# Patient Record
Sex: Female | Born: 1935 | Race: White | Hispanic: No | Smoking: Never smoker
Health system: Southern US, Community
[De-identification: ages and names within clinical notes are randomized; demographics above are authoritative.]

## PROBLEM LIST (undated history)

## (undated) DIAGNOSIS — I10 Essential (primary) hypertension: Secondary | ICD-10-CM

## (undated) DIAGNOSIS — K5790 Diverticulosis of intestine, part unspecified, without perforation or abscess without bleeding: Secondary | ICD-10-CM

## (undated) DIAGNOSIS — C189 Malignant neoplasm of colon, unspecified: Secondary | ICD-10-CM

## (undated) HISTORY — PX: CATARACT EXTRACTION: SUR2

## (undated) HISTORY — PX: PARTIAL COLECTOMY: SHX5273

---

## 1973-02-01 HISTORY — PX: VAGINAL HYSTERECTOMY: SUR661

## 2000-02-02 DIAGNOSIS — C189 Malignant neoplasm of colon, unspecified: Secondary | ICD-10-CM

## 2000-02-02 HISTORY — DX: Malignant neoplasm of colon, unspecified: C18.9

## 2000-02-02 HISTORY — PX: OTHER SURGICAL HISTORY: SHX169

## 2004-10-28 ENCOUNTER — Ambulatory Visit: Payer: Self-pay | Admitting: Internal Medicine

## 2005-10-29 ENCOUNTER — Ambulatory Visit: Payer: Self-pay | Admitting: Internal Medicine

## 2006-02-23 ENCOUNTER — Ambulatory Visit: Payer: Self-pay | Admitting: Unknown Physician Specialty

## 2006-11-01 ENCOUNTER — Ambulatory Visit: Payer: Self-pay | Admitting: Internal Medicine

## 2007-11-02 ENCOUNTER — Ambulatory Visit: Payer: Self-pay | Admitting: Internal Medicine

## 2008-11-06 ENCOUNTER — Ambulatory Visit: Payer: Self-pay | Admitting: Internal Medicine

## 2009-11-13 ENCOUNTER — Ambulatory Visit: Payer: Self-pay | Admitting: Internal Medicine

## 2010-12-01 ENCOUNTER — Ambulatory Visit: Payer: Self-pay | Admitting: Internal Medicine

## 2010-12-03 ENCOUNTER — Ambulatory Visit: Payer: Self-pay | Admitting: Internal Medicine

## 2010-12-28 ENCOUNTER — Ambulatory Visit: Payer: Self-pay | Admitting: Surgery

## 2010-12-29 LAB — PATHOLOGY REPORT

## 2011-02-02 HISTORY — PX: BREAST BIOPSY: SHX20

## 2011-03-25 ENCOUNTER — Ambulatory Visit: Payer: Self-pay | Admitting: Unknown Physician Specialty

## 2011-07-01 ENCOUNTER — Ambulatory Visit: Payer: Self-pay | Admitting: Surgery

## 2011-12-02 ENCOUNTER — Ambulatory Visit: Payer: Self-pay | Admitting: Internal Medicine

## 2012-12-05 ENCOUNTER — Ambulatory Visit: Payer: Self-pay | Admitting: Internal Medicine

## 2013-12-06 ENCOUNTER — Ambulatory Visit: Payer: Self-pay | Admitting: Internal Medicine

## 2014-10-17 ENCOUNTER — Other Ambulatory Visit: Payer: Self-pay | Admitting: Internal Medicine

## 2014-10-17 DIAGNOSIS — Z1231 Encounter for screening mammogram for malignant neoplasm of breast: Secondary | ICD-10-CM

## 2014-12-09 ENCOUNTER — Other Ambulatory Visit: Payer: Self-pay | Admitting: Internal Medicine

## 2014-12-09 ENCOUNTER — Ambulatory Visit
Admission: RE | Admit: 2014-12-09 | Discharge: 2014-12-09 | Disposition: A | Discharge status: Home / Self Care | Payer: Medicare Other | Source: Ambulatory Visit | Attending: Internal Medicine | Admitting: Internal Medicine

## 2014-12-09 DIAGNOSIS — Z1231 Encounter for screening mammogram for malignant neoplasm of breast: Secondary | ICD-10-CM | POA: Insufficient documentation

## 2014-12-09 HISTORY — DX: Malignant neoplasm of colon, unspecified: C18.9

## 2015-09-26 ENCOUNTER — Emergency Department (HOSPITAL_COMMUNITY): Payer: Medicare Other

## 2015-09-26 ENCOUNTER — Encounter (HOSPITAL_COMMUNITY): Payer: Self-pay | Admitting: Emergency Medicine

## 2015-09-26 ENCOUNTER — Inpatient Hospital Stay (HOSPITAL_COMMUNITY)
Admission: EM | Admit: 2015-09-26 | Discharge: 2015-10-01 | DRG: 473 | Disposition: A | Discharge status: Rehabilitation Facility | Payer: Medicare Other | Attending: Neurological Surgery | Admitting: Neurological Surgery

## 2015-09-26 DIAGNOSIS — S14126S Central cord syndrome at C6 level of cervical spinal cord, sequela: Secondary | ICD-10-CM | POA: Diagnosis not present

## 2015-09-26 DIAGNOSIS — S14129A Central cord syndrome at unspecified level of cervical spinal cord, initial encounter: Secondary | ICD-10-CM | POA: Diagnosis present

## 2015-09-26 DIAGNOSIS — N319 Neuromuscular dysfunction of bladder, unspecified: Secondary | ICD-10-CM | POA: Diagnosis not present

## 2015-09-26 DIAGNOSIS — S14109D Unspecified injury at unspecified level of cervical spinal cord, subsequent encounter: Secondary | ICD-10-CM | POA: Diagnosis not present

## 2015-09-26 DIAGNOSIS — G8918 Other acute postprocedural pain: Secondary | ICD-10-CM | POA: Diagnosis not present

## 2015-09-26 DIAGNOSIS — M542 Cervicalgia: Secondary | ICD-10-CM

## 2015-09-26 DIAGNOSIS — W100XXA Fall (on)(from) escalator, initial encounter: Secondary | ICD-10-CM | POA: Diagnosis present

## 2015-09-26 DIAGNOSIS — K592 Neurogenic bowel, not elsewhere classified: Secondary | ICD-10-CM | POA: Diagnosis not present

## 2015-09-26 DIAGNOSIS — Z419 Encounter for procedure for purposes other than remedying health state, unspecified: Secondary | ICD-10-CM

## 2015-09-26 DIAGNOSIS — S12600A Unspecified displaced fracture of seventh cervical vertebra, initial encounter for closed fracture: Principal | ICD-10-CM | POA: Diagnosis present

## 2015-09-26 DIAGNOSIS — M532X2 Spinal instabilities, cervical region: Secondary | ICD-10-CM | POA: Diagnosis present

## 2015-09-26 DIAGNOSIS — Z79899 Other long term (current) drug therapy: Secondary | ICD-10-CM | POA: Diagnosis not present

## 2015-09-26 DIAGNOSIS — M792 Neuralgia and neuritis, unspecified: Secondary | ICD-10-CM | POA: Diagnosis not present

## 2015-09-26 DIAGNOSIS — I10 Essential (primary) hypertension: Secondary | ICD-10-CM | POA: Diagnosis not present

## 2015-09-26 DIAGNOSIS — S14129D Central cord syndrome at unspecified level of cervical spinal cord, subsequent encounter: Secondary | ICD-10-CM | POA: Diagnosis not present

## 2015-09-26 DIAGNOSIS — M4322 Fusion of spine, cervical region: Secondary | ICD-10-CM | POA: Diagnosis present

## 2015-09-26 DIAGNOSIS — R269 Unspecified abnormalities of gait and mobility: Secondary | ICD-10-CM | POA: Diagnosis not present

## 2015-09-26 DIAGNOSIS — S14105A Unspecified injury at C5 level of cervical spinal cord, initial encounter: Secondary | ICD-10-CM

## 2015-09-26 DIAGNOSIS — Y9259 Other trade areas as the place of occurrence of the external cause: Secondary | ICD-10-CM

## 2015-09-26 DIAGNOSIS — S134XXA Sprain of ligaments of cervical spine, initial encounter: Secondary | ICD-10-CM

## 2015-09-26 DIAGNOSIS — S14109A Unspecified injury at unspecified level of cervical spinal cord, initial encounter: Secondary | ICD-10-CM | POA: Diagnosis present

## 2015-09-26 DIAGNOSIS — D72829 Elevated white blood cell count, unspecified: Secondary | ICD-10-CM

## 2015-09-26 HISTORY — DX: Essential (primary) hypertension: I10

## 2015-09-26 HISTORY — DX: Diverticulosis of intestine, part unspecified, without perforation or abscess without bleeding: K57.90

## 2015-09-26 LAB — COMPREHENSIVE METABOLIC PANEL
ALBUMIN: 3.7 g/dL (ref 3.5–5.0)
ALK PHOS: 71 U/L (ref 38–126)
ALT: 18 U/L (ref 14–54)
ANION GAP: 13 (ref 5–15)
AST: 29 U/L (ref 15–41)
BILIRUBIN TOTAL: 0.7 mg/dL (ref 0.3–1.2)
BUN: 15 mg/dL (ref 6–20)
CALCIUM: 9.5 mg/dL (ref 8.9–10.3)
CO2: 21 mmol/L — ABNORMAL LOW (ref 22–32)
Chloride: 104 mmol/L (ref 101–111)
Creatinine, Ser: 0.78 mg/dL (ref 0.44–1.00)
GFR calc Af Amer: >60 mL/min (ref >60)
GLUCOSE: 121 mg/dL — AB (ref 65–99)
Potassium: 3.7 mmol/L (ref 3.5–5.1)
Sodium: 138 mmol/L (ref 135–145)
TOTAL PROTEIN: 6.9 g/dL (ref 6.5–8.1)

## 2015-09-26 LAB — CBC WITH DIFFERENTIAL/PLATELET
BASOS PCT: 0 %
Basophils Absolute: 0 10*3/uL (ref 0.0–0.1)
Eosinophils Absolute: 0.2 10*3/uL (ref 0.0–0.7)
Eosinophils Relative: 2 %
HEMATOCRIT: 39.3 % (ref 36.0–46.0)
HEMOGLOBIN: 13 g/dL (ref 12.0–15.0)
LYMPHS ABS: 3.1 10*3/uL (ref 0.7–4.0)
LYMPHS PCT: 29 %
MCH: 29.7 pg (ref 26.0–34.0)
MCHC: 33.1 g/dL (ref 30.0–36.0)
MCV: 89.7 fL (ref 78.0–100.0)
MONO ABS: 0.7 10*3/uL (ref 0.1–1.0)
MONOS PCT: 7 %
NEUTROS ABS: 6.7 10*3/uL (ref 1.7–7.7)
NEUTROS PCT: 62 %
Platelets: 286 10*3/uL (ref 150–400)
RBC: 4.38 MIL/uL (ref 3.87–5.11)
RDW: 13.4 % (ref 11.5–15.5)
WBC: 10.8 10*3/uL — ABNORMAL HIGH (ref 4.0–10.5)

## 2015-09-26 LAB — PROTIME-INR
INR: 1.01
PROTHROMBIN TIME: 13.3 s (ref 11.4–15.2)

## 2015-09-26 LAB — I-STAT CG4 LACTIC ACID, ED: LACTIC ACID, VENOUS: 1.47 mmol/L (ref 0.5–1.9)

## 2015-09-26 MED ORDER — HYDROCODONE-ACETAMINOPHEN 5-325 MG PO TABS
1.0000 | ORAL_TABLET | ORAL | Status: DC | PRN
  Administered 2015-09-27 (×3): 2 via ORAL
  Administered 2015-09-27 (×2): 1 via ORAL
  Administered 2015-09-27 – 2015-09-29 (×8): 2 via ORAL
  Filled 2015-09-26 (×6): qty 2
  Filled 2015-09-26: qty 1
  Filled 2015-09-26 (×5): qty 2
  Filled 2015-09-26: qty 1

## 2015-09-26 MED ORDER — IOPAMIDOL (ISOVUE-300) INJECTION 61%
100.0000 mL | Freq: Once | INTRAVENOUS | Status: AC | PRN
  Administered 2015-09-26: 100 mL via INTRAVENOUS

## 2015-09-26 NOTE — H&P (Signed)
Subjective:   Patient is a 82 y.o. female seen regarding Central cord syndrome. Patient was [place] a [place] at a meeting earlier today and on an escalator when the escalator stopped and she fell backwards down several steps. She initially could not move her arms or legs. However, upon presentation to the emergency department by EMS she started to be able to move her legs though she continues to complain of burning and weakness in the hands. The patient first presented with complaints of arm pain and loss of strength of the arm(s). Onset of symptoms was a few hours ago, gradually improving since that time. Onset was related to a fall. The pain is described as very mild and occurs intermittently. The pain is rated mild, and is located at the base of the neck at times though she has moderate pain in the forearms.  Symptoms are exacerbated by none, and are relieved by none. History positive for trauma, details: As above. Previous work up includes cervical spine x-rays, results: presence of osteophytes and MRI of cervical spine, results: spinal stenosis.  No past medical history on file.  No past surgical history on file.  No Known Allergies  Social History  Substance Use Topics  . Smoking status: Not on file  . Smokeless tobacco: Not on file  . Alcohol use Not on file    No family history on file. Prior to Admission medications   Medication Sig Start Date End Date Taking? Authorizing Provider  calcium carbonate (OSCAL) 1500 (600 Ca) MG TABS tablet Take 1,500 mg by mouth 2 (two) times daily with a meal.   Yes Historical Provider, MD  Cholecalciferol (VITAMIN D) 2000 units tablet Take 2,000 Units by mouth daily.   Yes Historical Provider, MD  lisinopril-hydrochlorothiazide (PRINZIDE,ZESTORETIC) 20-25 MG tablet Take 1 tablet by mouth daily.   Yes Historical Provider, MD  Multiple Vitamin (MULTIVITAMIN WITH MINERALS) TABS tablet Take 1 tablet by mouth every evening.   Yes Historical  Provider, MD     Review of Systems  Positive ROS: neg  All other systems have been reviewed and were otherwise negative with the exception of those mentioned in the HPI and as above.  Objective: Vital signs in last 24 hours: Temp:  [97.2 F (36.2 C)] 97.2 F (36.2 C) (08/25 1708) Pulse Rate:  [68-95] 68 (08/25 2100) Resp:  [13-20] 17 (08/25 2100) BP: (156-178)/(66-82) 170/66 (08/25 2100) SpO2:  [95 %-99 %] 96 % (08/25 2100)  General Appearance: Alert, cooperative, no distress, appears stated age Head: Normocephalic, without obvious abnormality, atraumatic Eyes: PERRL, conjunctiva/corneas clear, EOM's intact      Throat: Lips, mucosa, and tongue normal; teeth and gums normal Neck: Supple, symmetrical, trachea midline, she is in an Aspen collar Back: Symmetric, no curvature, ROM normal, no CVA tenderness Lungs:  respirations unlabored Heart: Regular rate and rhythm Abdomen: Soft, non-tender, bowel sounds active all four quadrants, no masses, no organomegaly Extremities: Extremities normal, atraumatic, no cyanosis or edema, abrasions to left forearm Pulses: 2+ and symmetric all extremities Skin: Skin color, texture, turgor normal, no rashes or lesions  NEUROLOGIC:   Mental status: A&O x4, no aphasia, good AS, fund of knowledge and memory Motor Exam - grossly normal except for weakness of the handgrip measuring 4 minus out of 5 and weakness of the triceps measuring 4 minus out of 5 Sensory Exam - grossly normal except for may be some decreased light touch in a glove distribution Reflexes: Normal with no Hoffmann's or clonus Coordination -  may have mild discoordination Gait - not tested Balance - not tested Cranial Nerves: I: smell Not tested  II: visual acuity  OS: na    OD: na  II: visual fields Full to confrontation  II: pupils Equal, round, reactive to light  III,VII: ptosis None  III,IV,VI: extraocular muscles  Full ROM  V: mastication Normal  V: facial light touch  sensation  Normal  V,VII: corneal reflex  Present  VII: facial muscle function - upper  Normal  VII: facial muscle function - lower Normal  VIII: hearing Not tested  IX: soft palate elevation  Normal  IX,X: gag reflex Present  XI: trapezius strength  5/5  XI: sternocleidomastoid strength 5/5  XI: neck flexion strength  5/5  XII: tongue strength  Normal    Data Review Lab Results  Component Value Date   WBC 10.8 (H) 09/26/2015   HGB 13.0 09/26/2015   HCT 39.3 09/26/2015   MCV 89.7 09/26/2015   PLT 286 09/26/2015   Lab Results  Component Value Date   NA 138 09/26/2015   K 3.7 09/26/2015   CL 104 09/26/2015   CO2 21 (L) 09/26/2015   BUN 15 09/26/2015   CREATININE 0.78 09/26/2015   GLUCOSE 121 (H) 09/26/2015   Lab Results  Component Value Date   INR 1.01 09/26/2015   CT scan of the cervical spine shows cervical spondylosis at C5-6 and C6-7 but no obvious fracture  MRI of the cervical spine has been reviewed but the report has not been dictated by the radiologist but there is cervical spondylosis with a small area of signal change within the cord at the inferior part of the disc space of C6-7 with really no ongoing compression of the cord; there is signal change within the disc but I think it is probably degenerative in nature and not traumatic  Assessment/Plan: 82 year old female with a mild central cord syndrome with a small area of signal change within the cervical cord at C6-7 with no ongoing cord compression and an improving neurologic exam. No urgent or emergent cervical decompressive surgery is necessary. She may not need surgery at any time. She will need admission, pain control, therapy and probable rehabilitation. I suspect that her prognosis is quite good for a fairly full functional recovery. I have spent more than 30 minutes with she and her family in consultation and reviewed her MRI with the family and answered their questions to the best of my  ability   Inioluwa Baris S 09/26/2015 10:39 PM

## 2015-09-26 NOTE — Progress Notes (Signed)
Chaplain checked in on patient following x-rays and other procedures.   Son was present.  Patient appeared uncomfortable, serious but calm; she stated that she couldn't fully feel her hands. Offered emotional and spiritual support, and refreshments to son (who declined).    Please call as needed for f/u.     09/26/15 1900  Clinical Encounter Type  Visited With Patient and family together  Visit Type Initial  Referral From Physician  Consult/Referral To Chaplain  Stress Factors  Patient Stress Factors Health changes  Family Stress Factors Lack of knowledge

## 2015-09-26 NOTE — ED Notes (Signed)
Patient transported to MRI 

## 2015-09-26 NOTE — ED Notes (Signed)
Patient back from CT. Remains A&O x 4, denies pain.

## 2015-09-26 NOTE — ED Notes (Signed)
Patient alert and oriented.  Aspen collar remains on patient.  Off and on c/o tingling in her arms and hands.  Has not voided since this nurse has been here,

## 2015-09-26 NOTE — ED Notes (Signed)
Patient transported to CT 

## 2015-09-26 NOTE — ED Provider Notes (Signed)
[facility] DEPT Provider Note   CSN: [id number] Arrival date & time: 09/26/15  1704   History   Chief Complaint Chief Complaint  Patient presents with  . Fall    HPI Terri Bradley is a 82 y.o. female.  The history is provided by the patient and the EMS personnel.   82 year old female with history of colon cancer status post colectomy in early 2000s, hypertension presenting with fall. Onset was today, just prior to arrival. Context- patient was on an escalator that was going up, which then stopped moving, causing patient to lose her balance and start falling down the escalator. Associated symptoms include initially not being able to move both lower extremities and with weakness in her bilateral upper extremities, associated with neck pain. The symptoms have improved, and now patient only endorses some burning pain, numbness, and weakness in her bilateral upper extremities, most prominently in the hands. Mild severity currently. She denies LOC, and denies any precipitating symptoms to the fall, including palpitations, chest pain, shortness of breath, weakness, vision changes. She is not on anticoagulation or antiplatelets.   Past Medical History:  Diagnosis Date  . Hypertension     Patient Active Problem List   Diagnosis Date Noted  . 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 Central cord syndrome [facility]) 09/26/2015    Past Surgical History:  Procedure Laterality Date  . CATARACT EXTRACTION    . colon tumor removal  2002   tumor removed "somewhere in colon, no treatment was ever needed."    OB History    No data available       Home Medications    Prior to Admission medications   Medication Sig Start Date End Date Taking? Authorizing Provider  calcium carbonate (OSCAL) 1500 (600 Ca) MG TABS tablet Take 1,500 mg by mouth 2 (two) times daily with a meal.   Yes Historical Provider, MD  Cholecalciferol (VITAMIN D) 2000 units tablet Take 2,000 Units by mouth  daily.   Yes Historical Provider, MD  lisinopril-hydrochlorothiazide (PRINZIDE,ZESTORETIC) 20-25 MG tablet Take 1 tablet by mouth daily.   Yes Historical Provider, MD  Multiple Vitamin (MULTIVITAMIN WITH MINERALS) TABS tablet Take 1 tablet by mouth every evening.   Yes Historical Provider, MD    Family History No family history on file.  Social History Social History  Substance Use Topics  . Smoking status: Never Smoker  . Smokeless tobacco: Never Used  . Alcohol use No     Allergies   Review of patient's allergies indicates no known allergies.   Review of Systems Review of Systems  Constitutional: Negative for fever.  HENT: Negative for trouble swallowing.   Eyes: Negative for visual disturbance.  Respiratory: Negative for shortness of breath.   Cardiovascular: Negative for chest pain and palpitations.  Gastrointestinal: Negative for abdominal pain and vomiting.  Genitourinary: Negative for difficulty urinating.  Musculoskeletal: Positive for neck pain. Negative for back pain.  Skin: Positive for wound.  Allergic/Immunologic: Negative for immunocompromised state.  Neurological: Positive for weakness and numbness. Negative for syncope, facial asymmetry, speech difficulty and headaches.  Hematological: Does not bruise/bleed easily.  Psychiatric/Behavioral: Negative for confusion.  All other systems reviewed and are negative.    Physical Exam Updated Vital Signs BP 155/70   Pulse 70   Temp 97.2 F (36.2 C) (Oral)   Resp 17   Ht 5\' 9"  (1.753 m)   Wt 79.4 kg   SpO2 97%   BMI 25.84 kg/m   Physical Exam  Constitutional: She is oriented to person, place, and time. She appears well-developed and well-nourished. No distress.  HENT:  Head: Normocephalic and atraumatic.  No skull depressions or hematomas. No battles sign or raccoon eyes. No midface trauma. Mouth atraumatic. No hemotympanum.   Eyes: Conjunctivae are normal. Pupils are equal, round, and reactive to light.   Neck: Neck supple.  No midline C spine TTP, stepoff or deformity. Placed in aspen collar.   Cardiovascular: Normal rate, regular rhythm and intact distal pulses.   No murmur heard. Pulmonary/Chest: Effort normal and breath sounds normal. No respiratory distress.  Abdominal: Soft. There is no tenderness.  Musculoskeletal: She exhibits no edema or deformity.  No TTP over T or L spine, no stepoff or deformity  Neurological: She is alert and oriented to person, place, and time. She has normal reflexes. No cranial nerve deficit.  Strength intact in bilat lower extremities, symmetric, 5/5. 4/5 in bilat grip and elbow extension bilat in upper extremities, otherwise 5/5 in upper extremities. Diminished sensation to light touch throughout bilat hands.   Skin: Skin is warm and dry.  Small superficial abrasions to left lateral hip, bilat distal lower legs, left elbow. No bony deformity or effusions, ROM intact in adjacent joints  Psychiatric: She has a normal mood and affect.  Nursing note and vitals reviewed.    ED Treatments / Results  Labs (all labs ordered are listed, but only abnormal results are displayed) Labs Reviewed  CBC WITH DIFFERENTIAL/PLATELET - Abnormal; Notable for the following:       Result Value   WBC 10.8 (*)    All other components within normal limits  COMPREHENSIVE METABOLIC PANEL - Abnormal; Notable for the following:    CO2 21 (*)    Glucose, Bld 121 (*)    All other components within normal limits  LACTIC ACID, PLASMA  PROTIME-INR  LACTIC ACID, PLASMA  I-STAT CG4 LACTIC ACID, ED    EKG  EKG Interpretation None       Radiology Dg Pelvis 1-2 Views  Result Date: 09/26/2015 CLINICAL DATA:  Fall EXAM: PELVIS - 1-2 VIEW COMPARISON:  None. FINDINGS: No fracture or dislocation is seen. Mild joint space narrowing of the bilateral hips. Visualized bony pelvis appears intact. Degenerative changes of the lower lumbar spine. IMPRESSION: No fracture or dislocation is  seen. Electronically Signed   By: Julian Hy M.D.   On: 09/26/2015 18:30   Dg Elbow Complete Left  Result Date: 09/26/2015 CLINICAL DATA:  Fall EXAM: LEFT ELBOW - COMPLETE 3+ VIEW COMPARISON:  None. FINDINGS: No fracture or dislocation is seen. The joint spaces are preserved. Visualized soft tissues are within normal limits. No displaced elbow joint fat pads suggest an elbow joint effusion. No radiopaque foreign body is seen. IMPRESSION: No fracture, dislocation, or radiopaque foreign body is seen. Electronically Signed   By: Julian Hy M.D.   On: 09/26/2015 18:30   Dg Tibia/fibula Left  Result Date: 09/26/2015 CLINICAL DATA:  Fall EXAM: LEFT TIBIA AND FIBULA - 2 VIEW COMPARISON:  None. FINDINGS: No fracture or dislocation is seen. The joint spaces are preserved. Visualized soft tissues are within normal limits. IMPRESSION: No fracture or dislocation is seen. Electronically Signed   By: Julian Hy M.D.   On: 09/26/2015 18:31   Dg Tibia/fibula Right  Result Date: 09/26/2015 CLINICAL DATA:  Fall EXAM: RIGHT TIBIA AND FIBULA - 2 VIEW COMPARISON:  None. FINDINGS: No fracture or dislocation is seen. Mild degenerative changes of the knee. Visualized soft  tissues are within normal limits. IMPRESSION: No fracture or dislocation is seen. Electronically Signed   By: Julian Hy M.D.   On: 09/26/2015 18:31   Ct Head Wo Contrast  Addendum Date: 09/26/2015   ADDENDUM REPORT: 09/26/2015 23:06 ADDENDUM: Additional review of CT cervical spine examination demonstrates a nondisplaced anterior superior endplate corner fracture of the C7 vertebral body. Focal linear lucency and angularity at the superior articulating facet of C7 on the left suggesting an additional nondisplaced fracture in the posterior elements. Findings are better demonstrated on MRI cervical spine obtained today. See additional report. Electronically Signed   By: Lucienne Capers M.D.   On: 09/26/2015 23:06   Result Date:  09/26/2015 CLINICAL DATA:  Pt fell down an escalator. EXAM: CT HEAD WITHOUT CONTRAST CT CERVICAL SPINE WITHOUT CONTRAST TECHNIQUE: Multidetector CT imaging of the head and cervical spine was performed following the standard protocol without intravenous contrast. Multiplanar CT image reconstructions of the cervical spine were also generated. COMPARISON:  None. FINDINGS: CT HEAD FINDINGS Diffuse cerebral atrophy. Ventricular dilatation is likely due to central atrophy. Patchy low-attenuation changes in the deep white matter consistent with small vessel ischemia. Minimal basal ganglia calcifications, likely dystrophic. No mass effect or midline shift. No abnormal extra-axial fluid collections. Gray-white matter junctions are distinct. Basal cisterns are not effaced. No evidence of acute intracranial hemorrhage. No depressed skull fractures. Visualized paranasal sinuses and mastoid air cells are not opacified. CT CERVICAL SPINE FINDINGS Straightening of the usual cervical lordosis. This may be due to patient positioning but ligamentous injury or muscle spasm could also have this appearance and are not excluded. No anterior subluxation. Normal alignment of the cervical facet joints. Degenerative changes with narrowed cervical interspaces and endplate hypertrophic changes, most prominent at C4-5, C5-6, and C6-7 levels. Prominent hypertrophic changes demonstrated at C5-6 and C6-7 posteriorly may cause some impression on the central canal. Uncovertebral spurring causes bone encroachment upon neural foramina bilaterally. This is most prominent at C5-6 on the right. No vertebral compression deformities. No prevertebral soft tissue swelling. C1-2 articulation appears intact. No focal bone lesion or bone destruction. Vascular calcifications in the cervical carotid arteries. IMPRESSION: No acute intracranial abnormalities. Chronic atrophy and small vessel ischemic changes. Nonspecific straightening of usual cervical lordosis.  Diffuse degenerative changes in the cervical spine. No acute displaced fractures identified. Electronically Signed: By: Lucienne Capers M.D. On: 09/26/2015 18:52   Ct Chest W Contrast  Result Date: 09/26/2015 CLINICAL DATA:  Level 2 trauma. Pt fell down escalator. EXAM: CT CHEST, ABDOMEN, AND PELVIS WITH CONTRAST TECHNIQUE: Multidetector CT imaging of the chest, abdomen and pelvis was performed following the standard protocol during bolus administration of intravenous contrast. CONTRAST:  120mL ISOVUE-300 IOPAMIDOL (ISOVUE-300) INJECTION 61% COMPARISON:  None. FINDINGS: CT CHEST FINDINGS Mediastinum/Lymph Nodes: No masses, pathologically enlarged lymph nodes, or other significant abnormality. Normal caliber thoracic aorta. No aneurysm or dissection. Scattered calcification of the aorta. Calcification in the coronary arteries. Lungs/Pleura: Diffuse emphysematous changes in the lungs. Scattered fibrosis in the periphery in basis. No focal consolidation. Airways are patent. No pleural effusions. No pneumothorax. Musculoskeletal: No chest wall mass or suspicious bone lesions identified. Normal alignment of the thoracic spine. Degenerative changes throughout the spine. No compression deformities. Sternum and ribs appear intact. CT ABDOMEN PELVIS FINDINGS Hepatobiliary: Mild diffuse fatty infiltration of the liver. Gallbladder and bile ducts are unremarkable. Pancreas: No mass, inflammatory changes, or other significant abnormality. Spleen: Within normal limits in size and appearance. Adrenals/Urinary Tract: Minimal left adrenal gland nodule  measuring 10 mm diameter. Based on size criteria, likely to be benign. No evidence of hydronephrosis. Small parapelvic cysts in the kidneys. Stomach/Bowel: Prior right hemicolectomy with ileocolonic anastomosis. Stomach is decompressed. Small bowel are not abnormally distended. Colon is not distended. No free air or free fluid in the abdomen. Vascular/Lymphatic: No pathologically  enlarged lymph nodes. No evidence of abdominal aortic aneurysm. Calcification of the abdominal aorta. Reproductive: Surgical absence of the uterus. No pelvic mass or lymphadenopathy. Other: Diverticulosis of the sigmoid colon without evidence of diverticulitis. Musculoskeletal: Degenerative changes in the lumbar spine. No vertebral compression deformities. Sacrum, pelvis, and hips appear intact. Tarlov cysts in the sacrum. IMPRESSION: No acute posttraumatic changes suggested in the chest, abdomen, or pelvis. No evidence of mediastinal injury or pulmonary parenchymal injury. No evidence of solid organ injury or bowel perforation. Chronic changes as noted above. Electronically Signed   By: Lucienne Capers M.D.   On: 09/26/2015 19:34   Ct Cervical Spine Wo Contrast  Addendum Date: 09/26/2015   ADDENDUM REPORT: 09/26/2015 23:06 ADDENDUM: Additional review of CT cervical spine examination demonstrates a nondisplaced anterior superior endplate corner fracture of the C7 vertebral body. Focal linear lucency and angularity at the superior articulating facet of C7 on the left suggesting an additional nondisplaced fracture in the posterior elements. Findings are better demonstrated on MRI cervical spine obtained today. See additional report. Electronically Signed   By: Lucienne Capers M.D.   On: 09/26/2015 23:06   Result Date: 09/26/2015 CLINICAL DATA:  Pt fell down an escalator. EXAM: CT HEAD WITHOUT CONTRAST CT CERVICAL SPINE WITHOUT CONTRAST TECHNIQUE: Multidetector CT imaging of the head and cervical spine was performed following the standard protocol without intravenous contrast. Multiplanar CT image reconstructions of the cervical spine were also generated. COMPARISON:  None. FINDINGS: CT HEAD FINDINGS Diffuse cerebral atrophy. Ventricular dilatation is likely due to central atrophy. Patchy low-attenuation changes in the deep white matter consistent with small vessel ischemia. Minimal basal ganglia  calcifications, likely dystrophic. No mass effect or midline shift. No abnormal extra-axial fluid collections. Gray-white matter junctions are distinct. Basal cisterns are not effaced. No evidence of acute intracranial hemorrhage. No depressed skull fractures. Visualized paranasal sinuses and mastoid air cells are not opacified. CT CERVICAL SPINE FINDINGS Straightening of the usual cervical lordosis. This may be due to patient positioning but ligamentous injury or muscle spasm could also have this appearance and are not excluded. No anterior subluxation. Normal alignment of the cervical facet joints. Degenerative changes with narrowed cervical interspaces and endplate hypertrophic changes, most prominent at C4-5, C5-6, and C6-7 levels. Prominent hypertrophic changes demonstrated at C5-6 and C6-7 posteriorly may cause some impression on the central canal. Uncovertebral spurring causes bone encroachment upon neural foramina bilaterally. This is most prominent at C5-6 on the right. No vertebral compression deformities. No prevertebral soft tissue swelling. C1-2 articulation appears intact. No focal bone lesion or bone destruction. Vascular calcifications in the cervical carotid arteries. IMPRESSION: No acute intracranial abnormalities. Chronic atrophy and small vessel ischemic changes. Nonspecific straightening of usual cervical lordosis. Diffuse degenerative changes in the cervical spine. No acute displaced fractures identified. Electronically Signed: By: Lucienne Capers M.D. On: 09/26/2015 18:52   Mr Cervical Spine Wo Contrast  Result Date: 09/26/2015 CLINICAL DATA:  [place] [address] down escalator. Followup ligamentous injury or cord contusion. EXAM: MRI CERVICAL SPINE WITHOUT CONTRAST TECHNIQUE: Multiplanar, multisequence MR imaging of the cervical spine was performed. No intravenous contrast was administered. COMPARISON:  CT cervical spine September 26, 2015 at 1819 hours  FINDINGS: Alignment: Straightened cervical lordosis.  Minimal grade 1 C7-T1 anterolisthesis without spondylolysis. Vertebrae: Vertically oriented acute fracture through anterior superior endplate of C7 with focally disrupted anterior longitudinal ligament. T2 bright edema within the C6-7 disc. The remaining vertebral bodies are intact. Severe C6-7 disc height loss, moderate at C5-6 with decreased T2 signal within all disc compatible with desiccation. Moderate to severe chronic discogenic endplate changes [id number]-GRM and C6-7, moderate at C4-5. Cord: Subcentimeter focus of T2 bright signal within the spinal cord at C7, without volume loss. No syrinx. No susceptibility artifact to suggest hemorrhage. Craniocervical junction intact. Posterior Fossa, vertebral arteries, paraspinal tissues: Minimal effusion/hemorrhage within prevertebral space at C6-7. Interstitial bright STIR signal C6-7 interspinous space. Interstitial bright STIR signal within the head bilateral RIGHT greater than LEFT paraspinal soft tissues extending into the thoracic paraspinal soft tissues. Interstitial bright STIR signal RIGHT scalene muscles. Disc levels: C2-3: No disc bulge. Mild facet arthropathy. No canal stenosis or neural foraminal narrowing. C3-4: Uncovertebral hypertrophy and mild facet arthropathy without canal stenosis. Mild RIGHT neural foraminal narrowing. C4-5: Small central disc protrusion, uncovertebral hypertrophy mild facet arthropathy without canal stenosis. Mild RIGHT neural foraminal narrowing. C5-6: Small broad-based disc bulge, uncovertebral hypertrophy and mild facet arthropathy. Moderate canal stenosis. Severe RIGHT greater than LEFT neural foraminal narrowing. C6-7: Small broad-based disc bulge, uncovertebral hypertrophy mild facet arthropathy. Moderate canal stenosis. Severe RIGHT, moderate LEFT neural foraminal narrowing. C7-T1: Anterolisthesis. Severe RIGHT and mild LEFT facet arthropathy without canal stenosis. Severe RIGHT neural foraminal narrowing. IMPRESSION: Acute anterior  superior endplate nondisplaced C7 fracture. Focal anterior longitudinal ligament disruption C6-7. C6-7 interspinous ligament strain. Sub cm abnormal cord signal C7 concerning for cord contusion. No cord hemorrhage. Midgrade RIGHT greater than LEFT paraspinal and scalene muscle strain. Moderate canal stenosis C5-6 and C6-7. Neural foraminal narrowing C3-4 thru C7-T1: Severe at C5-6 through C7-T1. Electronically Signed   By: Elon Alas M.D.   On: 09/26/2015 22:41   Ct Abdomen Pelvis W Contrast  Result Date: 09/26/2015 CLINICAL DATA:  Level 2 trauma. Pt fell down escalator. EXAM: CT CHEST, ABDOMEN, AND PELVIS WITH CONTRAST TECHNIQUE: Multidetector CT imaging of the chest, abdomen and pelvis was performed following the standard protocol during bolus administration of intravenous contrast. CONTRAST:  133mL ISOVUE-300 IOPAMIDOL (ISOVUE-300) INJECTION 61% COMPARISON:  None. FINDINGS: CT CHEST FINDINGS Mediastinum/Lymph Nodes: No masses, pathologically enlarged lymph nodes, or other significant abnormality. Normal caliber thoracic aorta. No aneurysm or dissection. Scattered calcification of the aorta. Calcification in the coronary arteries. Lungs/Pleura: Diffuse emphysematous changes in the lungs. Scattered fibrosis in the periphery in basis. No focal consolidation. Airways are patent. No pleural effusions. No pneumothorax. Musculoskeletal: No chest wall mass or suspicious bone lesions identified. Normal alignment of the thoracic spine. Degenerative changes throughout the spine. No compression deformities. Sternum and ribs appear intact. CT ABDOMEN PELVIS FINDINGS Hepatobiliary: Mild diffuse fatty infiltration of the liver. Gallbladder and bile ducts are unremarkable. Pancreas: No mass, inflammatory changes, or other significant abnormality. Spleen: Within normal limits in size and appearance. Adrenals/Urinary Tract: Minimal left adrenal gland nodule measuring 10 mm diameter. Based on size criteria, likely to be  benign. No evidence of hydronephrosis. Small parapelvic cysts in the kidneys. Stomach/Bowel: Prior right hemicolectomy with ileocolonic anastomosis. Stomach is decompressed. Small bowel are not abnormally distended. Colon is not distended. No free air or free fluid in the abdomen. Vascular/Lymphatic: No pathologically enlarged lymph nodes. No evidence of abdominal aortic aneurysm. Calcification of the abdominal aorta. Reproductive: Surgical absence of the uterus. No pelvic  mass or lymphadenopathy. Other: Diverticulosis of the sigmoid colon without evidence of diverticulitis. Musculoskeletal: Degenerative changes in the lumbar spine. No vertebral compression deformities. Sacrum, pelvis, and hips appear intact. Tarlov cysts in the sacrum. IMPRESSION: No acute posttraumatic changes suggested in the chest, abdomen, or pelvis. No evidence of mediastinal injury or pulmonary parenchymal injury. No evidence of solid organ injury or bowel perforation. Chronic changes as noted above. Electronically Signed   By: Lucienne Capers M.D.   On: 09/26/2015 19:34    Procedures Procedures (including critical care time)  Medications Ordered in ED Medications  HYDROcodone-acetaminophen (NORCO/VICODIN) 5-325 MG per tablet 1-2 tablet (1 tablet Oral Given 09/27/15 0008)  iopamidol (ISOVUE-300) 61 % injection 100 mL (100 mLs Intravenous Contrast Given 09/26/15 1852)     Initial Impression / Assessment and Plan / ED Course  I have reviewed the triage vital signs and the nursing notes.  Pertinent labs & imaging results that were available during my care of the patient were reviewed by me and considered in my medical decision making (see chart for details).  Clinical Course    82 year old female with history of colon cancer status post colectomy in early 2000s, hypertension presenting with fall on escalator, as above. AF, VSS, ABCs intact on assessment in ED. Secondary exam notable for superficial abrasions, numbness in  bilateral hands and 4/5 strength on bilateral hand grip and elbow extension as above. Presentation concerning for possible spinal cord injury with central cord syndrome.   Tdap updated for superficial abrasions. No bony injuries on plain films. Labs ok. CT of head, CAP without acute traumatic injury, other than C spine CT with abnormal straightening of typical curvature of spine with concern for ligamentous injury. MRI confirmed cord contusion and ligamentous injury, C spine fracture. NSG consulted. Admitted to floor level of care with Neurosurgery for further care.   Case discussed with Dr. Billy Fischer, who oversaw management of this patient.    Final Clinical Impressions(s) / ED Diagnoses   Final diagnoses:  Neck pain  Contusion of cervical cord, initial encounter ([place])  Injury to ligament of cervical spine, initial encounter    New Prescriptions New Prescriptions   No medications on file     Ivin Booty, MD 09/28/15 [id number]    Gareth Morgan, MD 09/30/15 [phone]

## 2015-09-27 LAB — LACTIC ACID, PLASMA
LACTIC ACID, VENOUS: 1.2 mmol/L (ref 0.5–1.9)
Lactic Acid, Venous: 1.1 mmol/L (ref 0.5–1.9)

## 2015-09-27 MED ORDER — ONDANSETRON HCL 4 MG/2ML IJ SOLN: 4.0000 mg | INTRAMUSCULAR | Status: DC | PRN

## 2015-09-27 MED ORDER — SODIUM CHLORIDE 0.9 % IV SOLN: 250.0000 mL | INTRAVENOUS | Status: DC

## 2015-09-27 MED ORDER — ACETAMINOPHEN 650 MG RE SUPP
650.0000 mg | RECTAL | Status: DC | PRN
Start: 2015-09-27 — End: 2015-09-29

## 2015-09-27 MED ORDER — GABAPENTIN 100 MG PO CAPS
100.0000 mg | ORAL_CAPSULE | Freq: Two times a day (BID) | ORAL | Status: DC
  Administered 2015-09-27: 100 mg via ORAL
  Filled 2015-09-27: qty 1

## 2015-09-27 MED ORDER — POTASSIUM CHLORIDE IN NACL 20-0.9 MEQ/L-% IV SOLN
INTRAVENOUS | Status: DC
  Administered 2015-09-27: 01:00:00 via INTRAVENOUS
  Filled 2015-09-27 (×3): qty 1000

## 2015-09-27 MED ORDER — SODIUM CHLORIDE 0.9% FLUSH: 3.0000 mL | INTRAVENOUS | Status: DC | PRN

## 2015-09-27 MED ORDER — GABAPENTIN 300 MG PO CAPS
300.0000 mg | ORAL_CAPSULE | Freq: Two times a day (BID) | ORAL | Status: DC
  Administered 2015-09-27 – 2015-09-29 (×5): 300 mg via ORAL
  Filled 2015-09-27 (×5): qty 1

## 2015-09-27 MED ORDER — LISINOPRIL-HYDROCHLOROTHIAZIDE 20-25 MG PO TABS: 1.0000 | ORAL_TABLET | Freq: Every day | ORAL | Status: DC

## 2015-09-27 MED ORDER — ACETAMINOPHEN 325 MG PO TABS: 650.0000 mg | ORAL_TABLET | ORAL | Status: DC | PRN

## 2015-09-27 MED ORDER — SODIUM CHLORIDE 0.9% FLUSH
3.0000 mL | Freq: Two times a day (BID) | INTRAVENOUS | Status: DC
  Administered 2015-09-27 – 2015-09-28 (×3): 3 mL via INTRAVENOUS
  Administered 2015-09-28: 10 mL via INTRAVENOUS

## 2015-09-27 MED ORDER — ADULT MULTIVITAMIN W/MINERALS CH
1.0000 | ORAL_TABLET | Freq: Every evening | ORAL | Status: DC
  Administered 2015-09-27 – 2015-09-30 (×3): 1 via ORAL
  Filled 2015-09-27 (×3): qty 1

## 2015-09-27 MED ORDER — HYDROCHLOROTHIAZIDE 25 MG PO TABS
25.0000 mg | ORAL_TABLET | Freq: Every day | ORAL | Status: DC
  Administered 2015-09-27 – 2015-10-01 (×4): 25 mg via ORAL
  Filled 2015-09-27 (×5): qty 1

## 2015-09-27 MED ORDER — LISINOPRIL 20 MG PO TABS
20.0000 mg | ORAL_TABLET | Freq: Every day | ORAL | Status: DC
  Administered 2015-09-27 – 2015-10-01 (×4): 20 mg via ORAL
  Filled 2015-09-27 (×5): qty 1

## 2015-09-27 NOTE — Progress Notes (Signed)
Patient ID: Terri Bradley, female   DOB: 1927-08-26, 82 y.o.   MRN: [id number] Subjective:  The patient is alert and pleasant. She complains that her hands are stinging.  Objective: Vital signs in last 24 hours: Temp:  [97.2 F (36.2 C)-97.8 F (36.6 C)] 97.5 F (36.4 C) (08/26 0436) Pulse Rate:  [68-95] 69 (08/26 0436) Resp:  [13-20] 20 (08/26 0436) BP: (145-178)/(59-82) 145/61 (08/26 0436) SpO2:  [95 %-99 %] 98 % (08/26 0436) Weight:  [79.4 kg (175 lb)-79.4 kg (175 lb 0.7 oz)] 79.4 kg (175 lb 0.7 oz) (08/26 0042)  Intake/Output from previous day: 08/25 0701 - 08/26 0700 In: 181.3 [I.V.:181.3] Out: 0  Intake/Output this shift: No intake/output data recorded.  Physical exam the patient is alert and pleasant. Her strength is grossly normal in all 4 extremities except her handgrips are bit weak.  Lab Results:  Recent Labs  09/26/15 1732  WBC 10.8*  HGB 13.0  HCT 39.3  PLT 286   BMET  Recent Labs  09/26/15 1732  NA 138  K 3.7  CL 104  CO2 21*  GLUCOSE 121*  BUN 15  CREATININE 0.78  CALCIUM 9.5    Studies/Results: Dg Pelvis 1-2 Views  Result Date: 09/26/2015 CLINICAL DATA:  Fall EXAM: PELVIS - 1-2 VIEW COMPARISON:  None. FINDINGS: No fracture or dislocation is seen. Mild joint space narrowing of the bilateral hips. Visualized bony pelvis appears intact. Degenerative changes of the lower lumbar spine. IMPRESSION: No fracture or dislocation is seen. Electronically Signed   By: Julian Hy M.D.   On: 09/26/2015 18:30   Dg Elbow Complete Left  Result Date: 09/26/2015 CLINICAL DATA:  Fall EXAM: LEFT ELBOW - COMPLETE 3+ VIEW COMPARISON:  None. FINDINGS: No fracture or dislocation is seen. The joint spaces are preserved. Visualized soft tissues are within normal limits. No displaced elbow joint fat pads suggest an elbow joint effusion. No radiopaque foreign body is seen. IMPRESSION: No fracture, dislocation, or radiopaque foreign body is seen. Electronically  Signed   By: Julian Hy M.D.   On: 09/26/2015 18:30   Dg Tibia/fibula Left  Result Date: 09/26/2015 CLINICAL DATA:  Fall EXAM: LEFT TIBIA AND FIBULA - 2 VIEW COMPARISON:  None. FINDINGS: No fracture or dislocation is seen. The joint spaces are preserved. Visualized soft tissues are within normal limits. IMPRESSION: No fracture or dislocation is seen. Electronically Signed   By: Julian Hy M.D.   On: 09/26/2015 18:31   Dg Tibia/fibula Right  Result Date: 09/26/2015 CLINICAL DATA:  Fall EXAM: RIGHT TIBIA AND FIBULA - 2 VIEW COMPARISON:  None. FINDINGS: No fracture or dislocation is seen. Mild degenerative changes of the knee. Visualized soft tissues are within normal limits. IMPRESSION: No fracture or dislocation is seen. Electronically Signed   By: Julian Hy M.D.   On: 09/26/2015 18:31   Ct Head Wo Contrast  Addendum Date: 09/26/2015   ADDENDUM REPORT: 09/26/2015 23:06 ADDENDUM: Additional review of CT cervical spine examination demonstrates a nondisplaced anterior superior endplate corner fracture of the C7 vertebral body. Focal linear lucency and angularity at the superior articulating facet of C7 on the left suggesting an additional nondisplaced fracture in the posterior elements. Findings are better demonstrated on MRI cervical spine obtained today. See additional report. Electronically Signed   By: Lucienne Capers M.D.   On: 09/26/2015 23:06   Result Date: 09/26/2015 CLINICAL DATA:  Pt fell down an escalator. EXAM: CT HEAD WITHOUT CONTRAST CT CERVICAL SPINE WITHOUT CONTRAST TECHNIQUE: Multidetector  CT imaging of the head and cervical spine was performed following the standard protocol without intravenous contrast. Multiplanar CT image reconstructions of the cervical spine were also generated. COMPARISON:  None. FINDINGS: CT HEAD FINDINGS Diffuse cerebral atrophy. Ventricular dilatation is likely due to central atrophy. Patchy low-attenuation changes in the deep white matter  consistent with small vessel ischemia. Minimal basal ganglia calcifications, likely dystrophic. No mass effect or midline shift. No abnormal extra-axial fluid collections. Gray-white matter junctions are distinct. Basal cisterns are not effaced. No evidence of acute intracranial hemorrhage. No depressed skull fractures. Visualized paranasal sinuses and mastoid air cells are not opacified. CT CERVICAL SPINE FINDINGS Straightening of the usual cervical lordosis. This may be due to patient positioning but ligamentous injury or muscle spasm could also have this appearance and are not excluded. No anterior subluxation. Normal alignment of the cervical facet joints. Degenerative changes with narrowed cervical interspaces and endplate hypertrophic changes, most prominent at C4-5, C5-6, and C6-7 levels. Prominent hypertrophic changes demonstrated at C5-6 and C6-7 posteriorly may cause some impression on the central canal. Uncovertebral spurring causes bone encroachment upon neural foramina bilaterally. This is most prominent at C5-6 on the right. No vertebral compression deformities. No prevertebral soft tissue swelling. C1-2 articulation appears intact. No focal bone lesion or bone destruction. Vascular calcifications in the cervical carotid arteries. IMPRESSION: No acute intracranial abnormalities. Chronic atrophy and small vessel ischemic changes. Nonspecific straightening of usual cervical lordosis. Diffuse degenerative changes in the cervical spine. No acute displaced fractures identified. Electronically Signed: By: Lucienne Capers M.D. On: 09/26/2015 18:52   Ct Chest W Contrast  Result Date: 09/26/2015 CLINICAL DATA:  Level 2 trauma. Pt fell down escalator. EXAM: CT CHEST, ABDOMEN, AND PELVIS WITH CONTRAST TECHNIQUE: Multidetector CT imaging of the chest, abdomen and pelvis was performed following the standard protocol during bolus administration of intravenous contrast. CONTRAST:  155mL ISOVUE-300 IOPAMIDOL  (ISOVUE-300) INJECTION 61% COMPARISON:  None. FINDINGS: CT CHEST FINDINGS Mediastinum/Lymph Nodes: No masses, pathologically enlarged lymph nodes, or other significant abnormality. Normal caliber thoracic aorta. No aneurysm or dissection. Scattered calcification of the aorta. Calcification in the coronary arteries. Lungs/Pleura: Diffuse emphysematous changes in the lungs. Scattered fibrosis in the periphery in basis. No focal consolidation. Airways are patent. No pleural effusions. No pneumothorax. Musculoskeletal: No chest wall mass or suspicious bone lesions identified. Normal alignment of the thoracic spine. Degenerative changes throughout the spine. No compression deformities. Sternum and ribs appear intact. CT ABDOMEN PELVIS FINDINGS Hepatobiliary: Mild diffuse fatty infiltration of the liver. Gallbladder and bile ducts are unremarkable. Pancreas: No mass, inflammatory changes, or other significant abnormality. Spleen: Within normal limits in size and appearance. Adrenals/Urinary Tract: Minimal left adrenal gland nodule measuring 10 mm diameter. Based on size criteria, likely to be benign. No evidence of hydronephrosis. Small parapelvic cysts in the kidneys. Stomach/Bowel: Prior right hemicolectomy with ileocolonic anastomosis. Stomach is decompressed. Small bowel are not abnormally distended. Colon is not distended. No free air or free fluid in the abdomen. Vascular/Lymphatic: No pathologically enlarged lymph nodes. No evidence of abdominal aortic aneurysm. Calcification of the abdominal aorta. Reproductive: Surgical absence of the uterus. No pelvic mass or lymphadenopathy. Other: Diverticulosis of the sigmoid colon without evidence of diverticulitis. Musculoskeletal: Degenerative changes in the lumbar spine. No vertebral compression deformities. Sacrum, pelvis, and hips appear intact. Tarlov cysts in the sacrum. IMPRESSION: No acute posttraumatic changes suggested in the chest, abdomen, or pelvis. No evidence  of mediastinal injury or pulmonary parenchymal injury. No evidence of solid organ injury  or bowel perforation. Chronic changes as noted above. Electronically Signed   By: Lucienne Capers M.D.   On: 09/26/2015 19:34   Ct Cervical Spine Wo Contrast  Addendum Date: 09/26/2015   ADDENDUM REPORT: 09/26/2015 23:06 ADDENDUM: Additional review of CT cervical spine examination demonstrates a nondisplaced anterior superior endplate corner fracture of the C7 vertebral body. Focal linear lucency and angularity at the superior articulating facet of C7 on the left suggesting an additional nondisplaced fracture in the posterior elements. Findings are better demonstrated on MRI cervical spine obtained today. See additional report. Electronically Signed   By: Lucienne Capers M.D.   On: 09/26/2015 23:06   Result Date: 09/26/2015 CLINICAL DATA:  Pt fell down an escalator. EXAM: CT HEAD WITHOUT CONTRAST CT CERVICAL SPINE WITHOUT CONTRAST TECHNIQUE: Multidetector CT imaging of the head and cervical spine was performed following the standard protocol without intravenous contrast. Multiplanar CT image reconstructions of the cervical spine were also generated. COMPARISON:  None. FINDINGS: CT HEAD FINDINGS Diffuse cerebral atrophy. Ventricular dilatation is likely due to central atrophy. Patchy low-attenuation changes in the deep white matter consistent with small vessel ischemia. Minimal basal ganglia calcifications, likely dystrophic. No mass effect or midline shift. No abnormal extra-axial fluid collections. Gray-white matter junctions are distinct. Basal cisterns are not effaced. No evidence of acute intracranial hemorrhage. No depressed skull fractures. Visualized paranasal sinuses and mastoid air cells are not opacified. CT CERVICAL SPINE FINDINGS Straightening of the usual cervical lordosis. This may be due to patient positioning but ligamentous injury or muscle spasm could also have this appearance and are not excluded. No  anterior subluxation. Normal alignment of the cervical facet joints. Degenerative changes with narrowed cervical interspaces and endplate hypertrophic changes, most prominent at C4-5, C5-6, and C6-7 levels. Prominent hypertrophic changes demonstrated at C5-6 and C6-7 posteriorly may cause some impression on the central canal. Uncovertebral spurring causes bone encroachment upon neural foramina bilaterally. This is most prominent at C5-6 on the right. No vertebral compression deformities. No prevertebral soft tissue swelling. C1-2 articulation appears intact. No focal bone lesion or bone destruction. Vascular calcifications in the cervical carotid arteries. IMPRESSION: No acute intracranial abnormalities. Chronic atrophy and small vessel ischemic changes. Nonspecific straightening of usual cervical lordosis. Diffuse degenerative changes in the cervical spine. No acute displaced fractures identified. Electronically Signed: By: Lucienne Capers M.D. On: 09/26/2015 18:52   Mr Cervical Spine Wo Contrast  Result Date: 09/26/2015 CLINICAL DATA:  [place] down escalator. Followup ligamentous injury or cord contusion. EXAM: MRI CERVICAL SPINE WITHOUT CONTRAST TECHNIQUE: Multiplanar, multisequence MR imaging of the cervical spine was performed. No intravenous contrast was administered. COMPARISON:  CT cervical spine September 26, 2015 at 1819 hours FINDINGS: Alignment: Straightened cervical lordosis. Minimal grade 1 C7-T1 anterolisthesis without spondylolysis. Vertebrae: Vertically oriented acute fracture through anterior superior endplate of C7 with focally disrupted anterior longitudinal ligament. T2 bright edema within the C6-7 disc. The remaining vertebral bodies are intact. Severe C6-7 disc height loss, moderate at C5-6 with decreased T2 signal within all disc compatible with desiccation. Moderate to severe chronic discogenic endplate changes 075-GRM and C6-7, moderate at C4-5. Cord: Subcentimeter focus of T2 bright signal  within the spinal cord at C7, without volume loss. No syrinx. No susceptibility artifact to suggest hemorrhage. Craniocervical junction intact. Posterior Fossa, vertebral arteries, paraspinal tissues: Minimal effusion/hemorrhage within prevertebral space at C6-7. Interstitial bright STIR signal C6-7 interspinous space. Interstitial bright STIR signal within the head bilateral RIGHT greater than LEFT paraspinal soft tissues extending into the  thoracic paraspinal soft tissues. Interstitial bright STIR signal RIGHT scalene muscles. Disc levels: C2-3: No disc bulge. Mild facet arthropathy. No canal stenosis or neural foraminal narrowing. C3-4: Uncovertebral hypertrophy and mild facet arthropathy without canal stenosis. Mild RIGHT neural foraminal narrowing. C4-5: Small central disc protrusion, uncovertebral hypertrophy mild facet arthropathy without canal stenosis. Mild RIGHT neural foraminal narrowing. C5-6: Small broad-based disc bulge, uncovertebral hypertrophy and mild facet arthropathy. Moderate canal stenosis. Severe RIGHT greater than LEFT neural foraminal narrowing. C6-7: Small broad-based disc bulge, uncovertebral hypertrophy mild facet arthropathy. Moderate canal stenosis. Severe RIGHT, moderate LEFT neural foraminal narrowing. C7-T1: Anterolisthesis. Severe RIGHT and mild LEFT facet arthropathy without canal stenosis. Severe RIGHT neural foraminal narrowing. IMPRESSION: Acute anterior superior endplate nondisplaced C7 fracture. Focal anterior longitudinal ligament disruption C6-7. C6-7 interspinous ligament strain. Sub cm abnormal cord signal C7 concerning for cord contusion. No cord hemorrhage. Midgrade RIGHT greater than LEFT paraspinal and scalene muscle strain. Moderate canal stenosis C5-6 and C6-7. Neural foraminal narrowing C3-4 thru C7-T1: Severe at C5-6 through C7-T1. Electronically Signed   By: Elon Alas M.D.   On: 09/26/2015 22:41   Ct Abdomen Pelvis W Contrast  Result Date:  09/26/2015 CLINICAL DATA:  Level 2 trauma. Pt fell down escalator. EXAM: CT CHEST, ABDOMEN, AND PELVIS WITH CONTRAST TECHNIQUE: Multidetector CT imaging of the chest, abdomen and pelvis was performed following the standard protocol during bolus administration of intravenous contrast. CONTRAST:  148mL ISOVUE-300 IOPAMIDOL (ISOVUE-300) INJECTION 61% COMPARISON:  None. FINDINGS: CT CHEST FINDINGS Mediastinum/Lymph Nodes: No masses, pathologically enlarged lymph nodes, or other significant abnormality. Normal caliber thoracic aorta. No aneurysm or dissection. Scattered calcification of the aorta. Calcification in the coronary arteries. Lungs/Pleura: Diffuse emphysematous changes in the lungs. Scattered fibrosis in the periphery in basis. No focal consolidation. Airways are patent. No pleural effusions. No pneumothorax. Musculoskeletal: No chest wall mass or suspicious bone lesions identified. Normal alignment of the thoracic spine. Degenerative changes throughout the spine. No compression deformities. Sternum and ribs appear intact. CT ABDOMEN PELVIS FINDINGS Hepatobiliary: Mild diffuse fatty infiltration of the liver. Gallbladder and bile ducts are unremarkable. Pancreas: No mass, inflammatory changes, or other significant abnormality. Spleen: Within normal limits in size and appearance. Adrenals/Urinary Tract: Minimal left adrenal gland nodule measuring 10 mm diameter. Based on size criteria, likely to be benign. No evidence of hydronephrosis. Small parapelvic cysts in the kidneys. Stomach/Bowel: Prior right hemicolectomy with ileocolonic anastomosis. Stomach is decompressed. Small bowel are not abnormally distended. Colon is not distended. No free air or free fluid in the abdomen. Vascular/Lymphatic: No pathologically enlarged lymph nodes. No evidence of abdominal aortic aneurysm. Calcification of the abdominal aorta. Reproductive: Surgical absence of the uterus. No pelvic mass or lymphadenopathy. Other:  Diverticulosis of the sigmoid colon without evidence of diverticulitis. Musculoskeletal: Degenerative changes in the lumbar spine. No vertebral compression deformities. Sacrum, pelvis, and hips appear intact. Tarlov cysts in the sacrum. IMPRESSION: No acute posttraumatic changes suggested in the chest, abdomen, or pelvis. No evidence of mediastinal injury or pulmonary parenchymal injury. No evidence of solid organ injury or bowel perforation. Chronic changes as noted above. Electronically Signed   By: Lucienne Capers M.D.   On: 09/26/2015 19:34    Assessment/Plan: Central cord syndrome: I'll increase her Neurontin to see if we can help her with her hand stinging.  LOS: 1 day     Mersadez Linden D 09/27/2015, 7:02 AM

## 2015-09-27 NOTE — Evaluation (Signed)
Physical Therapy Evaluation Patient Details Name: ZAMORAH DENO MRN: [id number] DOB: 1927/10/01 Today's Date: 09/27/2015   History of Present Illness  Admitted after fall on escalator at a convention, resulting in [place] spinal cord contusion; motor return to UEs and LEs, still tingling bil UEs; opting for non-operative management; Collar at all times; PMH of HTN  Clinical Impression  Pt admitted with above diagnosis. Pt currently with functional limitations due to the deficits listed below (see PT Problem List). Pt was completely independent prior to this accident; will need to get full picture of home setup an davailable assist; Will request Rehab screen -- CIR will likely be best option for the most functional recovery;  Pt will benefit from skilled PT to increase their independence and safety with mobility to allow discharge to the venue listed below.    At initial time sitting up, Ms. Ryant described intense shoulder pain, beginning to cry, so we laid back down; In supine, she was able to tolerate AAROM into shoulder flexion without pain; palpation unrevealing; better tolerance of sitting up second time with L elbow supported; perhaps L shoulder was subluxed at initial trial sitting up?  OT, RN and MD notified of L shoulder pain     Follow Up Recommendations CIR    Equipment Recommendations  Rolling walker with 5" wheels;3in1 (PT)    Recommendations for Other Services Rehab consult     Precautions / Restrictions Precautions Precautions: Fall;Cervical Required Braces or Orthoses: Cervical Brace Cervical Brace: Hard collar;At all times      Mobility  Bed Mobility Overal bed mobility: Needs Assistance Bed Mobility: Rolling;Sidelying to Sit Rolling: Min assist Sidelying to sit: Mod assist       General bed mobility comments: verbal and tactile cues for technqiue; moad assist to elevate trunk to sit  Transfers Overall transfer level: Needs  assistance Equipment used: Rolling walker (2 wheeled) Transfers: Sit to/from Stand Sit to Stand: Mod assist         General transfer comment: Heavy mod assist to power up; R lean in standing  Ambulation/Gait             General Gait Details: Deferred to allow for RN to clarify activity orders  Stairs            Wheelchair Mobility    Modified Rankin (Stroke Patients Only)       Balance Overall balance assessment: Needs assistance Sitting-balance support: Single extremity supported Sitting balance-Leahy Scale: Fair     Standing balance support: Bilateral upper extremity supported Standing balance-Leahy Scale: Zero                               Pertinent Vitals/Pain Pain Assessment: Faces Faces Pain Scale: Hurts whole lot Pain Location: exquisiste pain in L shoulder once sitting up; elbow was unsupported Pain Descriptors / Indicators: Crying;Discomfort;Grimacing Pain Intervention(s): Repositioned;Other (comment) (then supported elbow with second attempt at getting up)    [place] expects to be discharged to:: Private residence Living Arrangements: Alone Available Help at Discharge: Friend(s);Available PRN/intermittently Type of Home: House                Prior Function Level of Independence: Independent               Hand Dominance        Extremity/Trunk Assessment   Upper Extremity Assessment: Defer to OT evaluation (at initial time sitting up, Ms. Leward Quan  described intense shoulder pain, beginning to cry, so we laid back down; In supine, she was able to tolerate AAROM into shoulder flexion without pain; palpation unrevealing; better tolerance of sitting up second time with L elbow supported; perhaps L shoulder was subluxed at initial trial sitting up?)           Lower Extremity Assessment: Generalized weakness (Needs further coordination testing)      Cervical / Trunk Assessment:  (in collar)   Communication   Communication: No difficulties  Cognition Arousal/Alertness: Awake/alert Behavior During Therapy: WFL for tasks assessed/performed;Flat affect Overall Cognitive Status: Impaired/Different from baseline Area of Impairment: Problem solving             Problem Solving: Slow processing;Requires verbal cues;Requires tactile cues      General Comments General comments (skin integrity, edema, etc.): Rn requested we stop session and lay back down as she had gotten in report that pt was on strict bedrest; Positioned the pt back in bed and RN paged MD    Exercises        Assessment/Plan    PT Assessment Patient needs continued PT services  PT Diagnosis Difficulty walking;Other (comment) (Central Cord Syndrome)   PT Problem List Decreased strength;Decreased activity tolerance;Decreased balance;Decreased mobility;Decreased coordination;Decreased cognition;Decreased knowledge of use of DME;Decreased safety awareness;Pain  PT Treatment Interventions DME instruction;Gait training;Functional mobility training;Therapeutic activities;Therapeutic exercise;Balance training;Neuromuscular re-education;Cognitive remediation;Patient/family education   PT Goals (Current goals can be found in the Care Plan section) Acute Rehab PT Goals Patient Stated Goal: Did not state PT Goal Formulation: With patient Time For Goal Achievement: 10/11/15 Potential to Achieve Goals: Good    Frequency Min 4X/week   Barriers to discharge   Need more info re; available assist at home to help with dc plan decision-making    Co-evaluation               End of Session Equipment Utilized During Treatment: Gait belt Activity Tolerance: Patient tolerated treatment well Patient left: in bed;with call bell/phone within reach Nurse Communication: Mobility status         Time: [id number] PT Time Calculation (min) (ACUTE ONLY): 27 min   Charges:   PT Evaluation $PT Eval Moderate Complexity:  1 Procedure PT Treatments $Therapeutic Activity: 8-22 mins   PT G Codes:        Quin Hoop 09/27/2015, 1:13 PM  [place], [place]  Acute Rehabilitation Services Pager [phone] Office [phone]

## 2015-09-27 NOTE — Progress Notes (Signed)
Rehab Admissions Coordinator Note:  Patient was screened by Cleatrice Burke for appropriateness for an Inpatient Acute Rehab Consult per PT and OT recommendations.  At this time, we are recommending Inpatient Rehab consult. Please place consult.  Cleatrice Burke 09/27/2015, 4:35 PM  I can be reached at [phone].

## 2015-09-27 NOTE — Progress Notes (Signed)
Pt admitted from ED status post a fall, alert and oriented, still c/o of pain in the neck and both arms, pt settled in bed with call light and family at bedside, v/s stable, reassured and will continue to monitor. [place]-Asidi, Adith Tejada Efe

## 2015-09-27 NOTE — Progress Notes (Signed)
OT Cancellation Note  Patient Details Name: DAYSHAWNA MANSPEAKER MRN: [id number] DOB: Jan 27, 1928   Cancelled Treatment:    Reason Eval/Treat Not Completed: Medical issues which prohibited therapy (awaiting clairfication of activity orders). Will follow up for OT eval as time allows and pt is appropriate.  Binnie Kand M.S., OTR/L Pager: [phone]  09/27/2015, 11:31 AM

## 2015-09-27 NOTE — Evaluation (Signed)
Occupational Therapy Evaluation Patient Details Name: Terri Bradley MRN: [id number] DOB: 04-Dec-1927 Today's Date: 09/27/2015    History of Present Illness Admitted after fall on escalator at a convention, resulting in [place] spinal cord contusion; motor return to UEs and LEs, still tingling bil UEs; opting for non-operative management; Collar at all times;    Clinical Impression   Pt reports she was independent with ADL PTA. Currently pt overall mod assist +2 for basic transfers, min assist for UB ADL, and max assist for LB ADL. Pt presenting with mild cognitive deficits, bil UE weakness and decreased sensation, intermittent L shoulder pain with movement and impaired balance in sitting and standing impacting her independence and safety with ADL and functional mobility. Recommending CIR level therapies to maximize independence and safety with ADL and functional mobility prior to return home. Pt would benefit from continued skilled OT to address established goals.    Follow Up Recommendations  CIR;Supervision/Assistance - 24 hour    Equipment Recommendations  Other (comment) (TBD at next venue)    Recommendations for Other Services Rehab consult     Precautions / Restrictions Precautions Precautions: Fall;Cervical Required Braces or Orthoses: Cervical Brace Cervical Brace: Hard collar;At all times Restrictions Weight Bearing Restrictions: No      Mobility Bed Mobility Overal bed mobility: Needs Assistance Bed Mobility: Rolling;Sidelying to Sit Rolling: Min assist Sidelying to sit: Mod assist       General bed mobility comments: Assist for elevation of trunk to sitting. Pt with L shoulder pain during bed mobility.  Transfers Overall transfer level: Needs assistance Equipment used: 2 person hand held assist Transfers: Sit to/from [facility] Sit to Stand: Mod assist;+2 safety/equipment Stand pivot transfers: Mod assist;+2 physical assistance        General transfer comment: Mod assist to boost up from EOB and for stand pivot to chair. Pt c/o pain throughout despite supporting bil UEs.    Balance Overall balance assessment: Needs assistance Sitting-balance support: Feet supported;Bilateral upper extremity supported Sitting balance-Leahy Scale: Fair     Standing balance support: Bilateral upper extremity supported Standing balance-Leahy Scale: Poor                              ADL Overall ADL's : Needs assistance/impaired Eating/Feeding: Set up;Sitting   Grooming: Minimal assistance;Sitting   Upper Body Bathing: Minimal assitance;Sitting   Lower Body Bathing: Maximal assistance;Sit to/from stand   Upper Body Dressing : Minimal assistance;Sitting Upper Body Dressing Details (indicate cue type and reason): to don hospital gown Lower Body Dressing: Maximal assistance;Sit to/from stand Lower Body Dressing Details (indicate cue type and reason): to adjust socks Toilet Transfer: Moderate assistance;+2 for physical assistance;Stand-pivot;BSC Toilet Transfer Details (indicate cue type and reason): Simulated by transfer from EOB to chair         Functional mobility during ADLs: Moderate assistance;+2 for physical assistance (for stand pivot only) General ADL Comments: Pt reports severe L shoulder pain then radiating across shoulders intermittently with movement. Able to perfrom full AROM against gravity without pain. Discussed need for further rehab; pt is agreeable. Educated on cervical precautions and log roll technique for bed mobility.     Vision Additional Comments: Appears WFL; needs further assessment.   Perception     Praxis      Pertinent Vitals/Pain Pain Assessment: Faces Faces Pain Scale: Hurts whole lot Pain Location: L shoulder, across shoulders intermittently Pain Descriptors / Indicators: Grimacing;Guarding;Sharp;Shooting Pain Intervention(s):  Monitored during session;Repositioned;Patient  requesting pain meds-RN notified     Hand Dominance Right   Extremity/Trunk Assessment Upper Extremity Assessment Upper Extremity Assessment: LUE deficits/detail;RUE deficits/detail RUE Deficits / Details: Able to perfrom full AROM without pain. Shoulder flex/ext 4/5, elbow flex/ext 4/5, wrist 3+/5, decreased grip strength. Pt reports pins and needles feeling in fingers. Decreased gross motor coordination. RUE Coordination: decreased gross motor;decreased fine motor LUE Deficits / Details: Intermittent pain during functional activities. Able to perfrom full AROM without pain. Shoulder flex/ext 4/5, elbow flex/ext 4/5, wrist 3+/5, decreased grip strength. Pt reports pins and needles feeling in fingers. Decreased gross motor coordination. Did not notice shoulder sublux with palpation. LUE Coordination: decreased gross motor;decreased fine motor   Lower Extremity Assessment Lower Extremity Assessment: Defer to PT evaluation   Cervical / Trunk Assessment Cervical / Trunk Assessment: Other exceptions Cervical / Trunk Exceptions: in cervical collar   Communication Communication Communication: No difficulties   Cognition Arousal/Alertness: Awake/alert Behavior During Therapy: WFL for tasks assessed/performed;Flat affect Overall Cognitive Status: Impaired/Different from baseline Area of Impairment: Memory;Safety/judgement;Problem solving     Memory: Decreased short-term memory   Safety/Judgement: Decreased awareness of deficits   Problem Solving: Slow processing;Requires verbal cues;Requires tactile cues General Comments: Pt kept stating "why am I just so sore all over" despite repeated explanantions of pts fall.   General Comments       Exercises       Shoulder Instructions      Home Living Family/patient expects to be discharged to:: Private residence Living Arrangements: Alone Available Help at Discharge: Friend(s);Available PRN/intermittently (may be able to arrange  24/7) Type of Home: House Home Access: Stairs to enter Entrance Stairs-Number of Steps: 1   Home Layout: One level     Bathroom Shower/Tub: Tub/shower unit;Walk-in shower (pt typically takes baths)   Bathroom Toilet: Standard     Home Equipment: Walker - 2 wheels;Bedside commode;Shower seat          Prior Functioning/Environment Level of Independence: Independent             OT Diagnosis: Generalized weakness;Cognitive deficits;Acute pain;Paresis;Altered mental status   OT Problem List: Decreased strength;Decreased activity tolerance;Impaired balance (sitting and/or standing);Decreased coordination;Decreased cognition;Decreased safety awareness;Decreased knowledge of use of DME or AE;Decreased knowledge of precautions;Impaired sensation;Impaired UE functional use;Pain   OT Treatment/Interventions: Self-care/ADL training;Therapeutic exercise;Energy conservation;DME and/or AE instruction;Therapeutic activities;Patient/family education;Balance training    OT Goals(Current goals can be found in the care plan section) Acute Rehab OT Goals Patient Stated Goal: decrease pain OT Goal Formulation: With patient Time For Goal Achievement: 10/11/15 Potential to Achieve Goals: Good ADL Goals Pt Will Perform Eating: with set-up;sitting (with or without AE) Pt Will Perform Grooming: with min guard assist;standing (with or without AE) Pt Will Perform Upper Body Bathing: with supervision;sitting Pt Will Perform Lower Body Bathing: with min guard assist;sit to/from stand (with or without AE) Pt Will Transfer to Toilet: with min guard assist;ambulating;bedside commode Pt Will Perform Toileting - Clothing Manipulation and hygiene: with min guard assist;sit to/from stand Pt/caregiver will Perform Home Exercise Program: Increased strength;Both right and left upper extremity;Independently;With written HEP provided (increase fine/gross motor coordination)  OT Frequency: Min 3X/week   Barriers  to D/C:            Co-evaluation              End of Session Equipment Utilized During Treatment: Gait belt;Cervical collar Nurse Communication: Mobility status;Other (comment);Patient requests pain meds (pt requesting to use [facility])  Activity  Tolerance: Patient limited by pain Patient left: in chair;with call bell/phone within reach;with chair alarm set;with family/visitor present   Time: 1350-1413 OT Time Calculation (min): 23 min Charges:  OT General Charges $OT Visit: 1 Procedure OT Evaluation $OT Eval Moderate Complexity: 1 Procedure OT Treatments $Therapeutic Activity: 8-22 mins G-Codes:      Binnie Kand M.S., OTR/L Pager: [phone]  09/27/2015, 2:36 PM

## 2015-09-28 ENCOUNTER — Inpatient Hospital Stay (HOSPITAL_COMMUNITY): Payer: Medicare Other

## 2015-09-28 NOTE — Progress Notes (Signed)
Patient ID: Terri Bradley, female   DOB: February 06, 1927, 83 y.o.   MRN: [id number] Lateral plain film x-ray shows subluxation of C6 on C7  measuring about 5 mm that was not there on her CT scan or MRI on the day of admission 2 days ago. Therefore I'm concerned about gross instability secondary to ligamentous disruption. I'm going to repeat her CT scan of the cervical spine. She potentially could need internal fixation from either posterior or anterior approach prior to going to rehabilitation.

## 2015-09-28 NOTE — Progress Notes (Signed)
Patient ID: Terri Bradley, female   DOB: January 29, 1928, 82 y.o.   MRN: [id number] Subjective: Patient reports less neck and shoulder soreness today. Still has stable weakness in the hands. Less burning and aching in the forearms and hands.  Objective: Vital signs in last 24 hours: Temp:  [97.8 F (36.6 C)-98.4 F (36.9 C)] 98.2 F (36.8 C) (08/27 0855) Pulse Rate:  [56-69] 59 (08/27 0855) Resp:  [18-20] 20 (08/27 0855) BP: (131-170)/(49-64) 133/49 (08/27 0855) SpO2:  [95 %-100 %] 95 % (08/27 0855)  Intake/Output from previous day: No intake/output data recorded. Intake/Output this shift: Total I/O In: 240 [P.O.:240] Out: -   Physical exam remains stable. Grips are 4 minus out of 5. Triceps 4 minus out of 5. Other muscle strength appears to be quite good. Remains in cervical collar. Out of bed to chair and looks comfortable.  Lab Results: Lab Results  Component Value Date   WBC 10.8 (H) 09/26/2015   HGB 13.0 09/26/2015   HCT 39.3 09/26/2015   MCV 89.7 09/26/2015   PLT 286 09/26/2015   Lab Results  Component Value Date   INR 1.01 09/26/2015   BMET Lab Results  Component Value Date   NA 138 09/26/2015   K 3.7 09/26/2015   CL 104 09/26/2015   CO2 21 (L) 09/26/2015   GLUCOSE 121 (H) 09/26/2015   BUN 15 09/26/2015   CREATININE 0.78 09/26/2015   CALCIUM 9.5 09/26/2015    Studies/Results: Dg Cervical Spine 2 Or 3 Views  Result Date: 09/28/2015 CLINICAL DATA:  Cervical clearing.  Spinal cord injury. EXAM: CERVICAL SPINE - 2-3 VIEW COMPARISON:  MRI 09/26/2015 FINDINGS: The previously seen fracture at C7 not well visualized by plain film. There is slight anterolisthesis of C6 on C7, approximately 5 mm. Degenerative disc disease in the mid to lower cervical spine. IMPRESSION: Unable to visualize the previously seen C7 fracture. Grade 1 anterolisthesis of C6 on C7. Spondylosis. Electronically Signed   By: Rolm Baptise M.D.   On: 09/28/2015 08:15   Dg Pelvis 1-2  Views  Result Date: 09/26/2015 CLINICAL DATA:  Fall EXAM: PELVIS - 1-2 VIEW COMPARISON:  None. FINDINGS: No fracture or dislocation is seen. Mild joint space narrowing of the bilateral hips. Visualized bony pelvis appears intact. Degenerative changes of the lower lumbar spine. IMPRESSION: No fracture or dislocation is seen. Electronically Signed   By: Julian Hy M.D.   On: 09/26/2015 18:30   Dg Elbow Complete Left  Result Date: 09/26/2015 CLINICAL DATA:  Fall EXAM: LEFT ELBOW - COMPLETE 3+ VIEW COMPARISON:  None. FINDINGS: No fracture or dislocation is seen. The joint spaces are preserved. Visualized soft tissues are within normal limits. No displaced elbow joint fat pads suggest an elbow joint effusion. No radiopaque foreign body is seen. IMPRESSION: No fracture, dislocation, or radiopaque foreign body is seen. Electronically Signed   By: Julian Hy M.D.   On: 09/26/2015 18:30   Dg Tibia/fibula Left  Result Date: 09/26/2015 CLINICAL DATA:  Fall EXAM: LEFT TIBIA AND FIBULA - 2 VIEW COMPARISON:  None. FINDINGS: No fracture or dislocation is seen. The joint spaces are preserved. Visualized soft tissues are within normal limits. IMPRESSION: No fracture or dislocation is seen. Electronically Signed   By: Julian Hy M.D.   On: 09/26/2015 18:31   Dg Tibia/fibula Right  Result Date: 09/26/2015 CLINICAL DATA:  Fall EXAM: RIGHT TIBIA AND FIBULA - 2 VIEW COMPARISON:  None. FINDINGS: No fracture or dislocation is seen. Mild degenerative changes  of the knee. Visualized soft tissues are within normal limits. IMPRESSION: No fracture or dislocation is seen. Electronically Signed   By: Julian Hy M.D.   On: 09/26/2015 18:31   Ct Head Wo Contrast  Addendum Date: 09/26/2015   ADDENDUM REPORT: 09/26/2015 23:06 ADDENDUM: Additional review of CT cervical spine examination demonstrates a nondisplaced anterior superior endplate corner fracture of the C7 vertebral body. Focal linear lucency and  angularity at the superior articulating facet of C7 on the left suggesting an additional nondisplaced fracture in the posterior elements. Findings are better demonstrated on MRI cervical spine obtained today. See additional report. Electronically Signed   By: Lucienne Capers M.D.   On: 09/26/2015 23:06   Result Date: 09/26/2015 CLINICAL DATA:  Pt fell down an escalator. EXAM: CT HEAD WITHOUT CONTRAST CT CERVICAL SPINE WITHOUT CONTRAST TECHNIQUE: Multidetector CT imaging of the head and cervical spine was performed following the standard protocol without intravenous contrast. Multiplanar CT image reconstructions of the cervical spine were also generated. COMPARISON:  None. FINDINGS: CT HEAD FINDINGS Diffuse cerebral atrophy. Ventricular dilatation is likely due to central atrophy. Patchy low-attenuation changes in the deep white matter consistent with small vessel ischemia. Minimal basal ganglia calcifications, likely dystrophic. No mass effect or midline shift. No abnormal extra-axial fluid collections. Gray-white matter junctions are distinct. Basal cisterns are not effaced. No evidence of acute intracranial hemorrhage. No depressed skull fractures. Visualized paranasal sinuses and mastoid air cells are not opacified. CT CERVICAL SPINE FINDINGS Straightening of the usual cervical lordosis. This may be due to patient positioning but ligamentous injury or muscle spasm could also have this appearance and are not excluded. No anterior subluxation. Normal alignment of the cervical facet joints. Degenerative changes with narrowed cervical interspaces and endplate hypertrophic changes, most prominent at C4-5, C5-6, and C6-7 levels. Prominent hypertrophic changes demonstrated at C5-6 and C6-7 posteriorly may cause some impression on the central canal. Uncovertebral spurring causes bone encroachment upon neural foramina bilaterally. This is most prominent at C5-6 on the right. No vertebral compression deformities. No  prevertebral soft tissue swelling. C1-2 articulation appears intact. No focal bone lesion or bone destruction. Vascular calcifications in the cervical carotid arteries. IMPRESSION: No acute intracranial abnormalities. Chronic atrophy and small vessel ischemic changes. Nonspecific straightening of usual cervical lordosis. Diffuse degenerative changes in the cervical spine. No acute displaced fractures identified. Electronically Signed: By: Lucienne Capers M.D. On: 09/26/2015 18:52   Ct Chest W Contrast  Result Date: 09/26/2015 CLINICAL DATA:  Level 2 trauma. Pt fell down escalator. EXAM: CT CHEST, ABDOMEN, AND PELVIS WITH CONTRAST TECHNIQUE: Multidetector CT imaging of the chest, abdomen and pelvis was performed following the standard protocol during bolus administration of intravenous contrast. CONTRAST:  117mL ISOVUE-300 IOPAMIDOL (ISOVUE-300) INJECTION 61% COMPARISON:  None. FINDINGS: CT CHEST FINDINGS Mediastinum/Lymph Nodes: No masses, pathologically enlarged lymph nodes, or other significant abnormality. Normal caliber thoracic aorta. No aneurysm or dissection. Scattered calcification of the aorta. Calcification in the coronary arteries. Lungs/Pleura: Diffuse emphysematous changes in the lungs. Scattered fibrosis in the periphery in basis. No focal consolidation. Airways are patent. No pleural effusions. No pneumothorax. Musculoskeletal: No chest wall mass or suspicious bone lesions identified. Normal alignment of the thoracic spine. Degenerative changes throughout the spine. No compression deformities. Sternum and ribs appear intact. CT ABDOMEN PELVIS FINDINGS Hepatobiliary: Mild diffuse fatty infiltration of the liver. Gallbladder and bile ducts are unremarkable. Pancreas: No mass, inflammatory changes, or other significant abnormality. Spleen: Within normal limits in size and appearance. Adrenals/Urinary Tract:  Minimal left adrenal gland nodule measuring 10 mm diameter. Based on size criteria, likely to  be benign. No evidence of hydronephrosis. Small parapelvic cysts in the kidneys. Stomach/Bowel: Prior right hemicolectomy with ileocolonic anastomosis. Stomach is decompressed. Small bowel are not abnormally distended. Colon is not distended. No free air or free fluid in the abdomen. Vascular/Lymphatic: No pathologically enlarged lymph nodes. No evidence of abdominal aortic aneurysm. Calcification of the abdominal aorta. Reproductive: Surgical absence of the uterus. No pelvic mass or lymphadenopathy. Other: Diverticulosis of the sigmoid colon without evidence of diverticulitis. Musculoskeletal: Degenerative changes in the lumbar spine. No vertebral compression deformities. Sacrum, pelvis, and hips appear intact. Tarlov cysts in the sacrum. IMPRESSION: No acute posttraumatic changes suggested in the chest, abdomen, or pelvis. No evidence of mediastinal injury or pulmonary parenchymal injury. No evidence of solid organ injury or bowel perforation. Chronic changes as noted above. Electronically Signed   By: Lucienne Capers M.D.   On: 09/26/2015 19:34   Ct Cervical Spine Wo Contrast  Addendum Date: 09/26/2015   ADDENDUM REPORT: 09/26/2015 23:06 ADDENDUM: Additional review of CT cervical spine examination demonstrates a nondisplaced anterior superior endplate corner fracture of the C7 vertebral body. Focal linear lucency and angularity at the superior articulating facet of C7 on the left suggesting an additional nondisplaced fracture in the posterior elements. Findings are better demonstrated on MRI cervical spine obtained today. See additional report. Electronically Signed   By: Lucienne Capers M.D.   On: 09/26/2015 23:06   Result Date: 09/26/2015 CLINICAL DATA:  Pt fell down an escalator. EXAM: CT HEAD WITHOUT CONTRAST CT CERVICAL SPINE WITHOUT CONTRAST TECHNIQUE: Multidetector CT imaging of the head and cervical spine was performed following the standard protocol without intravenous contrast. Multiplanar CT  image reconstructions of the cervical spine were also generated. COMPARISON:  None. FINDINGS: CT HEAD FINDINGS Diffuse cerebral atrophy. Ventricular dilatation is likely due to central atrophy. Patchy low-attenuation changes in the deep white matter consistent with small vessel ischemia. Minimal basal ganglia calcifications, likely dystrophic. No mass effect or midline shift. No abnormal extra-axial fluid collections. Gray-white matter junctions are distinct. Basal cisterns are not effaced. No evidence of acute intracranial hemorrhage. No depressed skull fractures. Visualized paranasal sinuses and mastoid air cells are not opacified. CT CERVICAL SPINE FINDINGS Straightening of the usual cervical lordosis. This may be due to patient positioning but ligamentous injury or muscle spasm could also have this appearance and are not excluded. No anterior subluxation. Normal alignment of the cervical facet joints. Degenerative changes with narrowed cervical interspaces and endplate hypertrophic changes, most prominent at C4-5, C5-6, and C6-7 levels. Prominent hypertrophic changes demonstrated at C5-6 and C6-7 posteriorly may cause some impression on the central canal. Uncovertebral spurring causes bone encroachment upon neural foramina bilaterally. This is most prominent at C5-6 on the right. No vertebral compression deformities. No prevertebral soft tissue swelling. C1-2 articulation appears intact. No focal bone lesion or bone destruction. Vascular calcifications in the cervical carotid arteries. IMPRESSION: No acute intracranial abnormalities. Chronic atrophy and small vessel ischemic changes. Nonspecific straightening of usual cervical lordosis. Diffuse degenerative changes in the cervical spine. No acute displaced fractures identified. Electronically Signed: By: Lucienne Capers M.D. On: 09/26/2015 18:52   Mr Cervical Spine Wo Contrast  Result Date: 09/26/2015 CLINICAL DATA:  [place] [address] down escalator. Followup  ligamentous injury or cord contusion. EXAM: MRI CERVICAL SPINE WITHOUT CONTRAST TECHNIQUE: Multiplanar, multisequence MR imaging of the cervical spine was performed. No intravenous contrast was administered. COMPARISON:  CT cervical spine  September 26, 2015 at 1819 hours FINDINGS: Alignment: Straightened cervical lordosis. Minimal grade 1 C7-T1 anterolisthesis without spondylolysis. Vertebrae: Vertically oriented acute fracture through anterior superior endplate of C7 with focally disrupted anterior longitudinal ligament. T2 bright edema within the C6-7 disc. The remaining vertebral bodies are intact. Severe C6-7 disc height loss, moderate at C5-6 with decreased T2 signal within all disc compatible with desiccation. Moderate to severe chronic discogenic endplate changes [id number]-GRM and C6-7, moderate at C4-5. Cord: Subcentimeter focus of T2 bright signal within the spinal cord at C7, without volume loss. No syrinx. No susceptibility artifact to suggest hemorrhage. Craniocervical junction intact. Posterior Fossa, vertebral arteries, paraspinal tissues: Minimal effusion/hemorrhage within prevertebral space at C6-7. Interstitial bright STIR signal C6-7 interspinous space. Interstitial bright STIR signal within the head bilateral RIGHT greater than LEFT paraspinal soft tissues extending into the thoracic paraspinal soft tissues. Interstitial bright STIR signal RIGHT scalene muscles. Disc levels: C2-3: No disc bulge. Mild facet arthropathy. No canal stenosis or neural foraminal narrowing. C3-4: Uncovertebral hypertrophy and mild facet arthropathy without canal stenosis. Mild RIGHT neural foraminal narrowing. C4-5: Small central disc protrusion, uncovertebral hypertrophy mild facet arthropathy without canal stenosis. Mild RIGHT neural foraminal narrowing. C5-6: Small broad-based disc bulge, uncovertebral hypertrophy and mild facet arthropathy. Moderate canal stenosis. Severe RIGHT greater than LEFT neural foraminal narrowing. C6-7:  Small broad-based disc bulge, uncovertebral hypertrophy mild facet arthropathy. Moderate canal stenosis. Severe RIGHT, moderate LEFT neural foraminal narrowing. C7-T1: Anterolisthesis. Severe RIGHT and mild LEFT facet arthropathy without canal stenosis. Severe RIGHT neural foraminal narrowing. IMPRESSION: Acute anterior superior endplate nondisplaced C7 fracture. Focal anterior longitudinal ligament disruption C6-7. C6-7 interspinous ligament strain. Sub cm abnormal cord signal C7 concerning for cord contusion. No cord hemorrhage. Midgrade RIGHT greater than LEFT paraspinal and scalene muscle strain. Moderate canal stenosis C5-6 and C6-7. Neural foraminal narrowing C3-4 thru C7-T1: Severe at C5-6 through C7-T1. Electronically Signed   By: Elon Alas M.D.   On: 09/26/2015 22:41   Ct Abdomen Pelvis W Contrast  Result Date: 09/26/2015 CLINICAL DATA:  Level 2 trauma. Pt fell down escalator. EXAM: CT CHEST, ABDOMEN, AND PELVIS WITH CONTRAST TECHNIQUE: Multidetector CT imaging of the chest, abdomen and pelvis was performed following the standard protocol during bolus administration of intravenous contrast. CONTRAST:  115mL ISOVUE-300 IOPAMIDOL (ISOVUE-300) INJECTION 61% COMPARISON:  None. FINDINGS: CT CHEST FINDINGS Mediastinum/Lymph Nodes: No masses, pathologically enlarged lymph nodes, or other significant abnormality. Normal caliber thoracic aorta. No aneurysm or dissection. Scattered calcification of the aorta. Calcification in the coronary arteries. Lungs/Pleura: Diffuse emphysematous changes in the lungs. Scattered fibrosis in the periphery in basis. No focal consolidation. Airways are patent. No pleural effusions. No pneumothorax. Musculoskeletal: No chest wall mass or suspicious bone lesions identified. Normal alignment of the thoracic spine. Degenerative changes throughout the spine. No compression deformities. Sternum and ribs appear intact. CT ABDOMEN PELVIS FINDINGS Hepatobiliary: Mild diffuse fatty  infiltration of the liver. Gallbladder and bile ducts are unremarkable. Pancreas: No mass, inflammatory changes, or other significant abnormality. Spleen: Within normal limits in size and appearance. Adrenals/Urinary Tract: Minimal left adrenal gland nodule measuring 10 mm diameter. Based on size criteria, likely to be benign. No evidence of hydronephrosis. Small parapelvic cysts in the kidneys. Stomach/Bowel: Prior right hemicolectomy with ileocolonic anastomosis. Stomach is decompressed. Small bowel are not abnormally distended. Colon is not distended. No free air or free fluid in the abdomen. Vascular/Lymphatic: No pathologically enlarged lymph nodes. No evidence of abdominal aortic aneurysm. Calcification of the abdominal aorta. Reproductive: Surgical absence  of the uterus. No pelvic mass or lymphadenopathy. Other: Diverticulosis of the sigmoid colon without evidence of diverticulitis. Musculoskeletal: Degenerative changes in the lumbar spine. No vertebral compression deformities. Sacrum, pelvis, and hips appear intact. Tarlov cysts in the sacrum. IMPRESSION: No acute posttraumatic changes suggested in the chest, abdomen, or pelvis. No evidence of mediastinal injury or pulmonary parenchymal injury. No evidence of solid organ injury or bowel perforation. Chronic changes as noted above. Electronically Signed   By: Lucienne Capers M.D.   On: 09/26/2015 19:34    Assessment/Plan: Central cord syndrome with weakness in the hands and triceps. Remain in collar. I have seen the report of her C-spine x-ray but I have yet to see the images. Rehabilitation consult placed.   LOS: 2 days    Min Collymore S 09/28/2015, 10:33 AM

## 2015-09-29 ENCOUNTER — Encounter (HOSPITAL_COMMUNITY)
Admission: EM | Disposition: A | Discharge status: Rehabilitation Facility | Payer: Self-pay | Source: Home / Self Care | Attending: Neurological Surgery

## 2015-09-29 ENCOUNTER — Encounter (HOSPITAL_COMMUNITY): Payer: Self-pay | Admitting: Certified Registered"

## 2015-09-29 ENCOUNTER — Inpatient Hospital Stay (HOSPITAL_COMMUNITY): Payer: Medicare Other | Admitting: Anesthesiology

## 2015-09-29 ENCOUNTER — Inpatient Hospital Stay (HOSPITAL_COMMUNITY): Payer: Medicare Other

## 2015-09-29 DIAGNOSIS — S12600A Unspecified displaced fracture of seventh cervical vertebra, initial encounter for closed fracture: Principal | ICD-10-CM

## 2015-09-29 DIAGNOSIS — D72829 Elevated white blood cell count, unspecified: Secondary | ICD-10-CM

## 2015-09-29 DIAGNOSIS — S14109D Unspecified injury at unspecified level of cervical spinal cord, subsequent encounter: Secondary | ICD-10-CM

## 2015-09-29 DIAGNOSIS — S14129D Central cord syndrome at unspecified level of cervical spinal cord, subsequent encounter: Secondary | ICD-10-CM

## 2015-09-29 DIAGNOSIS — S14105A Unspecified injury at C5 level of cervical spinal cord, initial encounter: Secondary | ICD-10-CM

## 2015-09-29 DIAGNOSIS — M4322 Fusion of spine, cervical region: Secondary | ICD-10-CM | POA: Diagnosis present

## 2015-09-29 DIAGNOSIS — I10 Essential (primary) hypertension: Secondary | ICD-10-CM

## 2015-09-29 DIAGNOSIS — M542 Cervicalgia: Secondary | ICD-10-CM

## 2015-09-29 HISTORY — PX: ANTERIOR CERVICAL DECOMP/DISCECTOMY FUSION: SHX1161

## 2015-09-29 LAB — SURGICAL PCR SCREEN
MRSA, PCR: NEGATIVE
STAPHYLOCOCCUS AUREUS: NEGATIVE

## 2015-09-29 SURGERY — ANTERIOR CERVICAL DECOMPRESSION/DISCECTOMY FUSION 1 LEVEL
Anesthesia: General | Site: Neck

## 2015-09-29 MED ORDER — PROPOFOL 10 MG/ML IV BOLUS
INTRAVENOUS | Status: AC
  Filled 2015-09-29: qty 20

## 2015-09-29 MED ORDER — PHENOL 1.4 % MT LIQD: 1.0000 | OROMUCOSAL | Status: DC | PRN

## 2015-09-29 MED ORDER — THROMBIN 5000 UNITS EX SOLR
OROMUCOSAL | Status: DC | PRN
  Administered 2015-09-29: 17:00:00 via TOPICAL

## 2015-09-29 MED ORDER — BUPIVACAINE HCL (PF) 0.25 % IJ SOLN
INTRAMUSCULAR | Status: DC | PRN
  Administered 2015-09-29: 7 mL

## 2015-09-29 MED ORDER — 0.9 % SODIUM CHLORIDE (POUR BTL) OPTIME
TOPICAL | Status: DC | PRN
  Administered 2015-09-29: 1000 mL

## 2015-09-29 MED ORDER — ONDANSETRON HCL 4 MG/2ML IJ SOLN
INTRAMUSCULAR | Status: DC | PRN
  Administered 2015-09-29: 4 mg via INTRAVENOUS

## 2015-09-29 MED ORDER — ROCURONIUM BROMIDE 10 MG/ML (PF) SYRINGE
PREFILLED_SYRINGE | INTRAVENOUS | Status: AC
  Filled 2015-09-29: qty 10

## 2015-09-29 MED ORDER — LIDOCAINE 2% (20 MG/ML) 5 ML SYRINGE
INTRAMUSCULAR | Status: AC
  Filled 2015-09-29: qty 5

## 2015-09-29 MED ORDER — HYDROCODONE-ACETAMINOPHEN 5-325 MG PO TABS
1.0000 | ORAL_TABLET | ORAL | Status: DC | PRN
  Administered 2015-09-29 – 2015-10-01 (×4): 2 via ORAL
  Filled 2015-09-29 (×4): qty 2

## 2015-09-29 MED ORDER — ONDANSETRON HCL 4 MG/2ML IJ SOLN: 4.0000 mg | INTRAMUSCULAR | Status: DC | PRN

## 2015-09-29 MED ORDER — CEFAZOLIN IN D5W 1 GM/50ML IV SOLN
1.0000 g | Freq: Three times a day (TID) | INTRAVENOUS | Status: AC
  Administered 2015-09-29 – 2015-09-30 (×2): 1 g via INTRAVENOUS
  Filled 2015-09-29 (×2): qty 50

## 2015-09-29 MED ORDER — SUGAMMADEX SODIUM 200 MG/2ML IV SOLN
INTRAVENOUS | Status: DC | PRN
  Administered 2015-09-29: 160 mg via INTRAVENOUS

## 2015-09-29 MED ORDER — SUCCINYLCHOLINE CHLORIDE 20 MG/ML IJ SOLN
INTRAMUSCULAR | Status: DC | PRN
  Administered 2015-09-29: 80 mg via INTRAVENOUS

## 2015-09-29 MED ORDER — ACETAMINOPHEN 325 MG PO TABS: 650.0000 mg | ORAL_TABLET | ORAL | Status: DC | PRN

## 2015-09-29 MED ORDER — SODIUM CHLORIDE 0.9 % IV SOLN: 250.0000 mL | INTRAVENOUS | Status: DC

## 2015-09-29 MED ORDER — ONDANSETRON HCL 4 MG/2ML IJ SOLN
INTRAMUSCULAR | Status: AC
  Filled 2015-09-29: qty 2

## 2015-09-29 MED ORDER — THROMBIN 5000 UNITS EX SOLR
CUTANEOUS | Status: DC | PRN
  Administered 2015-09-29 (×2): 5000 [IU] via TOPICAL

## 2015-09-29 MED ORDER — LIDOCAINE HCL (CARDIAC) 20 MG/ML IV SOLN
INTRAVENOUS | Status: DC | PRN
  Administered 2015-09-29: 70 mg via INTRAVENOUS

## 2015-09-29 MED ORDER — PROMETHAZINE HCL 25 MG/ML IJ SOLN: 6.2500 mg | INTRAMUSCULAR | Status: DC | PRN

## 2015-09-29 MED ORDER — MENTHOL 3 MG MT LOZG: 1.0000 | LOZENGE | OROMUCOSAL | Status: DC | PRN

## 2015-09-29 MED ORDER — SUGAMMADEX SODIUM 200 MG/2ML IV SOLN
INTRAVENOUS | Status: AC
  Filled 2015-09-29: qty 2

## 2015-09-29 MED ORDER — ROCURONIUM BROMIDE 100 MG/10ML IV SOLN
INTRAVENOUS | Status: DC | PRN
  Administered 2015-09-29: 30 mg via INTRAVENOUS

## 2015-09-29 MED ORDER — SODIUM CHLORIDE 0.9% FLUSH
3.0000 mL | INTRAVENOUS | Status: DC | PRN
  Administered 2015-09-29: 3 mL via INTRAVENOUS
  Filled 2015-09-29: qty 3

## 2015-09-29 MED ORDER — PROPOFOL 10 MG/ML IV BOLUS
INTRAVENOUS | Status: DC | PRN
  Administered 2015-09-29: 130 mg via INTRAVENOUS

## 2015-09-29 MED ORDER — BACITRACIN 50000 UNITS IM SOLR
INTRAMUSCULAR | Status: DC | PRN
  Administered 2015-09-29: 17:00:00

## 2015-09-29 MED ORDER — CEFAZOLIN SODIUM-DEXTROSE 2-4 GM/100ML-% IV SOLN
INTRAVENOUS | Status: AC
  Filled 2015-09-29: qty 100

## 2015-09-29 MED ORDER — FENTANYL CITRATE (PF) 100 MCG/2ML IJ SOLN: 25.0000 ug | INTRAMUSCULAR | Status: DC | PRN

## 2015-09-29 MED ORDER — HEMOSTATIC AGENTS (NO CHARGE) OPTIME
TOPICAL | Status: DC | PRN
  Administered 2015-09-29: 1 via TOPICAL

## 2015-09-29 MED ORDER — FENTANYL CITRATE (PF) 100 MCG/2ML IJ SOLN
INTRAMUSCULAR | Status: DC | PRN
  Administered 2015-09-29 (×2): 50 ug via INTRAVENOUS

## 2015-09-29 MED ORDER — PHENYLEPHRINE HCL 10 MG/ML IJ SOLN
INTRAVENOUS | Status: DC | PRN
  Administered 2015-09-29: 50 ug/min via INTRAVENOUS

## 2015-09-29 MED ORDER — SODIUM CHLORIDE 0.9% FLUSH
3.0000 mL | Freq: Two times a day (BID) | INTRAVENOUS | Status: DC
  Administered 2015-09-29 – 2015-10-01 (×4): 3 mL via INTRAVENOUS

## 2015-09-29 MED ORDER — LACTATED RINGERS IV SOLN
INTRAVENOUS | Status: DC | PRN
Start: 2015-09-29 — End: 2015-09-29
  Administered 2015-09-29: 16:00:00 via INTRAVENOUS

## 2015-09-29 MED ORDER — ACETAMINOPHEN 650 MG RE SUPP: 650.0000 mg | RECTAL | Status: DC | PRN

## 2015-09-29 MED ORDER — POTASSIUM CHLORIDE IN NACL 20-0.9 MEQ/L-% IV SOLN
INTRAVENOUS | Status: DC
  Administered 2015-09-29: via INTRAVENOUS
  Filled 2015-09-29 (×3): qty 1000

## 2015-09-29 MED ORDER — CEFAZOLIN SODIUM-DEXTROSE 2-3 GM-% IV SOLR
INTRAVENOUS | Status: DC | PRN
  Administered 2015-09-29: 2 g via INTRAVENOUS

## 2015-09-29 MED ORDER — MORPHINE SULFATE (PF) 2 MG/ML IV SOLN
1.0000 mg | INTRAVENOUS | Status: DC | PRN
  Administered 2015-09-29: 2 mg via INTRAVENOUS
  Filled 2015-09-29: qty 1

## 2015-09-29 MED ORDER — PHENYLEPHRINE HCL 10 MG/ML IJ SOLN
INTRAMUSCULAR | Status: DC | PRN
Start: 2015-09-29 — End: 2015-09-29
  Administered 2015-09-29: 160 ug via INTRAVENOUS
  Administered 2015-09-29 (×2): 120 ug via INTRAVENOUS

## 2015-09-29 MED ORDER — FENTANYL CITRATE (PF) 100 MCG/2ML IJ SOLN
INTRAMUSCULAR | Status: AC
  Filled 2015-09-29: qty 2

## 2015-09-29 SURGICAL SUPPLY — 43 items
BAG DECANTER FOR FLEXI CONT (MISCELLANEOUS) ×3 IMPLANT
BENZOIN TINCTURE PRP APPL 2/3 (GAUZE/BANDAGES/DRESSINGS) ×3 IMPLANT
BUR MATCHSTICK NEURO 3.0 LAGG (BURR) ×3 IMPLANT
CAGE COROENT SM 6X13X15 (Cage) ×3 IMPLANT
CANISTER SUCT 3000ML PPV (MISCELLANEOUS) ×3 IMPLANT
CLOSURE WOUND 1/2 X4 (GAUZE/BANDAGES/DRESSINGS) ×1
DRAPE C-ARM 42X72 X-RAY (DRAPES) ×6 IMPLANT
DRAPE LAPAROTOMY 100X72 PEDS (DRAPES) ×3 IMPLANT
DRAPE MICROSCOPE LEICA (MISCELLANEOUS) ×3 IMPLANT
DRAPE POUCH INSTRU U-SHP 10X18 (DRAPES) ×3 IMPLANT
DRILL BIT HELIX (BIT) ×3 IMPLANT
DRSG OPSITE POSTOP 3X4 (GAUZE/BANDAGES/DRESSINGS) ×3 IMPLANT
DURAPREP 6ML APPLICATOR 50/CS (WOUND CARE) ×3 IMPLANT
ELECT COATED BLADE 2.86 ST (ELECTRODE) ×3 IMPLANT
ELECT REM PT RETURN 9FT ADLT (ELECTROSURGICAL) ×3
ELECTRODE REM PT RTRN 9FT ADLT (ELECTROSURGICAL) ×1 IMPLANT
GAUZE SPONGE 4X4 16PLY XRAY LF (GAUZE/BANDAGES/DRESSINGS) IMPLANT
GLOVE BIO SURGEON STRL SZ8 (GLOVE) ×3 IMPLANT
GOWN STRL REUS W/ TWL LRG LVL3 (GOWN DISPOSABLE) IMPLANT
GOWN STRL REUS W/ TWL XL LVL3 (GOWN DISPOSABLE) IMPLANT
GOWN STRL REUS W/TWL 2XL LVL3 (GOWN DISPOSABLE) ×3 IMPLANT
GOWN STRL REUS W/TWL LRG LVL3 (GOWN DISPOSABLE)
GOWN STRL REUS W/TWL XL LVL3 (GOWN DISPOSABLE)
HEMOSTAT POWDER KIT SURGIFOAM (HEMOSTASIS) ×3 IMPLANT
KIT BASIN OR (CUSTOM PROCEDURE TRAY) ×3 IMPLANT
KIT ROOM TURNOVER OR (KITS) ×3 IMPLANT
NEEDLE HYPO 25X1 1.5 SAFETY (NEEDLE) ×3 IMPLANT
NEEDLE SPNL 20GX3.5 QUINCKE YW (NEEDLE) ×3 IMPLANT
NS IRRIG 1000ML POUR BTL (IV SOLUTION) ×3 IMPLANT
PACK LAMINECTOMY NEURO (CUSTOM PROCEDURE TRAY) ×3 IMPLANT
PAD ARMBOARD 7.5X6 YLW CONV (MISCELLANEOUS) ×3 IMPLANT
PIN DISTRACTION 14MM (PIN) ×3 IMPLANT
PLATE ARCHON 1-LEVEL 22MM (Plate) ×3 IMPLANT
RUBBERBAND STERILE (MISCELLANEOUS) ×6 IMPLANT
SCREW ARCHON SELFTAP 4.0X15MM (Screw) ×12 IMPLANT
SPONGE INTESTINAL PEANUT (DISPOSABLE) ×3 IMPLANT
SPONGE SURGIFOAM ABS GEL SZ50 (HEMOSTASIS) ×3 IMPLANT
STRIP CLOSURE SKIN 1/2X4 (GAUZE/BANDAGES/DRESSINGS) ×2 IMPLANT
SUT VIC AB 3-0 SH 8-18 (SUTURE) ×6 IMPLANT
TOWEL OR 17X24 6PK STRL BLUE (TOWEL DISPOSABLE) ×3 IMPLANT
TOWEL OR 17X26 10 PK STRL BLUE (TOWEL DISPOSABLE) ×3 IMPLANT
TRAP SPECIMEN MUCOUS 40CC (MISCELLANEOUS) IMPLANT
WATER STERILE IRR 1000ML POUR (IV SOLUTION) ×3 IMPLANT

## 2015-09-29 NOTE — Consult Note (Signed)
Physical Medicine and Rehabilitation Consult Reason for Consult: Central cord syndrome Referring Physician: Dr. Sherley Bounds   HPI: Terri Bradley is a 82 y.o. right handed female with history of hypertension. Per chart review patient lives alone independent prior to admission. Presented 09/26/2015 after a fall on an escalator while attending a convention meeting. Reported she fell backwards down several steps to the escalator. Complaints of burning sensation in all extremities. Denied loss of consciousness. Cranial CT scan negative. CT/MRI cervical spine shows cervical spondylosis at C5-6 and 6-7 but no obvious fracture. There was a small area of signal change within the cord at C6-7 with no ongoing cord compression. Lateral plain film showed subluxation of C6-7 measuring about 5 mm that were not there on her CT or MRI upon admission. Await plan for possible surgical intervention.. Maintain in  Aspen cervical collar. Hospital course pain management. Physical occupational therapy evaluations completed with recommendations of physical medicine rehabilitation consult.   Review of Systems  Constitutional: Negative for chills and fever.       Weakness to upper extremities  HENT: Negative for hearing loss.   Eyes: Negative for blurred vision and double vision.  Respiratory: Negative for cough and shortness of breath.   Cardiovascular: Negative for chest pain, palpitations and leg swelling.  Gastrointestinal: Positive for constipation. Negative for nausea and vomiting.  Genitourinary: Negative for dysuria and hematuria.  Musculoskeletal: Positive for falls and myalgias.  Skin: Negative for rash.  Neurological: Positive for sensory change, focal weakness and headaches. Negative for seizures.  All other systems reviewed and are negative.  Past Medical History:  Diagnosis Date  . Hypertension    Past Surgical History:  Procedure Laterality Date  . CATARACT EXTRACTION    . colon tumor  removal  2002   tumor removed "somewhere in colon, no treatment was ever needed."   No pertinent family history. Social History:  reports that she has never smoked. She has never used smokeless tobacco. She reports that she does not drink alcohol. Her drug history is not on file. Allergies: No Known Allergies Medications Prior to Admission  Medication Sig Dispense Refill  . calcium carbonate (OSCAL) 1500 (600 Ca) MG TABS tablet Take 1,500 mg by mouth 2 (two) times daily with a meal.    . Cholecalciferol (VITAMIN D) 2000 units tablet Take 2,000 Units by mouth daily.    Marland Kitchen lisinopril-hydrochlorothiazide (PRINZIDE,ZESTORETIC) 20-25 MG tablet Take 1 tablet by mouth daily.    . Multiple Vitamin (MULTIVITAMIN WITH MINERALS) TABS tablet Take 1 tablet by mouth every evening.      Home: Home Living Family/patient expects to be discharged to:: Private residence Living Arrangements: Alone Available Help at Discharge: Friend(s), Available PRN/intermittently (may be able to arrange 24/7) Type of Home: House Home Access: Stairs to enter [employer] of Steps: 1 Home Layout: One level Bathroom Shower/Tub: Tub/shower unit, Walk-in shower (pt typically takes baths) Biochemist, clinical: Standard Home Equipment: Environmental consultant - 2 wheels, Bedside commode, Shower seat  Functional History: Prior Function Level of Independence: Independent Functional Status:  Mobility: Bed Mobility Overal bed mobility: Needs Assistance Bed Mobility: Rolling, Sidelying to Sit Rolling: Min assist Sidelying to sit: Mod assist General bed mobility comments: Assist for elevation of trunk to sitting. Pt with L shoulder pain during bed mobility. Transfers Overall transfer level: Needs assistance Equipment used: 2 person hand held assist Transfers: Sit to/from Stand, Stand Pivot Transfers Sit to Stand: Mod assist, +2 safety/equipment Stand pivot transfers: Mod assist, +  2 physical assistance General transfer comment: Mod  assist to boost up from EOB and for stand pivot to chair. Pt c/o pain throughout despite supporting bil UEs. Ambulation/Gait General Gait Details: Deferred to allow for RN to clarify activity orders    ADL: ADL Overall ADL's : Needs assistance/impaired Eating/Feeding: Set up, Sitting Grooming: Minimal assistance, Sitting Upper Body Bathing: Minimal assitance, Sitting Lower Body Bathing: Maximal assistance, Sit to/from stand Upper Body Dressing : Minimal assistance, Sitting Upper Body Dressing Details (indicate cue type and reason): to don hospital gown Lower Body Dressing: Maximal assistance, Sit to/from stand Lower Body Dressing Details (indicate cue type and reason): to adjust socks Toilet Transfer: Moderate assistance, +2 for physical assistance, Stand-pivot, BSC Toilet Transfer Details (indicate cue type and reason): Simulated by transfer from EOB to chair Functional mobility during ADLs: Moderate assistance, +2 for physical assistance (for stand pivot only) General ADL Comments: Pt reports severe L shoulder pain then radiating across shoulders intermittently with movement. Able to perfrom full AROM against gravity without pain. Discussed need for further rehab; pt is agreeable. Educated on cervical precautions and log roll technique for bed mobility.  Cognition: Cognition Overall Cognitive Status: Impaired/Different from baseline Orientation Level: Oriented X4 Cognition Arousal/Alertness: Awake/alert Behavior During Therapy: WFL for tasks assessed/performed, Flat affect Overall Cognitive Status: Impaired/Different from baseline Area of Impairment: Memory, Safety/judgement, Problem solving Memory: Decreased short-term memory Safety/Judgement: Decreased awareness of deficits Problem Solving: Slow processing, Requires verbal cues, Requires tactile cues General Comments: Pt kept stating "why am I just so sore all over" despite repeated explanantions of pts fall.  Blood pressure  (!) 121/57, pulse 64, temperature 97.7 F (36.5 C), temperature source Oral, resp. rate 18, height 5\' 9"  (1.753 m), weight 79.4 kg (175 lb 0.7 oz), SpO2 94 %. Physical Exam  Vitals reviewed. Constitutional: She is oriented to person, place, and time. She appears well-developed and well-nourished.  HENT:  Head: Normocephalic.  Right Ear: External ear normal.  Left Ear: External ear normal.  Eyes: Conjunctivae and EOM are normal.  Neck:  Cervical collar in place  Cardiovascular: Normal rate and regular rhythm.   Respiratory: Effort normal and breath sounds normal. No respiratory distress.  GI: Soft. Bowel sounds are normal. She exhibits no distension.  Musculoskeletal:  No edema or tenderness in extremities  Neurological: She is alert and oriented to person, place, and time.  Motor: B/l UE Shoulder abduction, elbow flexion 4/5, elbow extension, wrist extension, hand grip 3+/5 LLE: Hip flexion 3/5, knee extension 3+/5, ankle dorsi/plantar flexion 4-/5 RLE: Hip flexion 3+/5, knee extension 3+/5, ankle dorsi/plantar flexion 4/5 Sensation diminished to light touch b/l hands DTRs brisk b/l UE  Skin: Skin is warm and dry.  Psychiatric: She has a normal mood and affect. Her behavior is normal.    No results found for this or any previous visit (from the past 24 hour(s)). Dg Cervical Spine 2 Or 3 Views  Result Date: 09/28/2015 CLINICAL DATA:  Cervical clearing.  Spinal cord injury. EXAM: CERVICAL SPINE - 2-3 VIEW COMPARISON:  MRI 09/26/2015 FINDINGS: The previously seen fracture at C7 not well visualized by plain film. There is slight anterolisthesis of C6 on C7, approximately 5 mm. Degenerative disc disease in the mid to lower cervical spine. IMPRESSION: Unable to visualize the previously seen C7 fracture. Grade 1 anterolisthesis of C6 on C7. Spondylosis. Electronically Signed   By: Rolm Baptise M.D.   On: 09/28/2015 08:15   Dg Cervical Spine With Flex & Extend  Result Date:  09/28/2015 CLINICAL DATA:  C7 cervical fracture. EXAM: CERVICAL SPINE COMPLETE WITH FLEXION AND EXTENSION VIEWS COMPARISON:  CT of the cervix spine 09/28/2015 FINDINGS: There is a known C7 vertebral body fracture. There is a 4.5 mm anterior listhesis of C6 on C7 in neutral position, which increases to a 9 mm with flexion and 7 mm with extension. Multilevel osteoarthritic changes of the cervical spine as seen. No prevertebral soft tissue thickening. IMPRESSION: Known C7 vertebral body fracture which shows instability on flexion and extension with increasing anterior listhesis of C6 on C7. These results will be called to the ordering clinician or representative by the Radiologist Assistant, and communication documented in the PACS or zVision Dashboard. Electronically Signed   By: Fidela Salisbury M.D.   On: 09/28/2015 16:38   Ct Cervical Spine Wo Contrast  Result Date: 09/28/2015 CLINICAL DATA:  Fall backwards down escalator. Neck pain. C7 fracture. EXAM: CT CERVICAL SPINE WITHOUT CONTRAST TECHNIQUE: Multidetector CT imaging of the cervical spine was performed without intravenous contrast. Multiplanar CT image reconstructions were also generated. COMPARISON:  Plain films 09/28/2015. Cervical spine CT 09/26/2015. MRI 09/26/2014. FINDINGS: Again noted is the fracture through the instead anterior superior corner of C7 as seen on prior CT and MRI. Fractures also noted through the posterior elements on the left at C7 involving the left superior facet. This is unchanged since prior study. 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 No additional fracture noted. Alignment is normal. IMPRESSION: Again noted are fractures through the anterior superior endplate at C7 and the left C7 superior facet. Findings stable since prior studies. Electronically Signed   By: Rolm Baptise M.D.   On: 09/28/2015 14:10    Assessment/Plan: Diagnosis: Central cord syndrome Labs and images independently  reviewed.  Records reviewed and summated above.     Skin: daily skin checks     Cardiovascular: anticipate orthostasis when OOB. May use     abdominal binder, TEDs or ace wraps to BLE for this. If ineffective, consider salt     tabs,     midodrine or fludrocortisone.       Psych: psychology consult for adjustment to disability for pt and family     Pain Management:  control with oral medications if possible     Bladder:  serial PVRs to r/o retention/atonic bladder. in/out clean catherization if needed. Implement bladder program . Encourage self I&O cath training vs     indwelling foley if possible to improve mobility, reduce infection, and     increase safety     Bowel: stress ulcer ppx..  Implement mechanical and chemical bowel program if necessary and care  training with scheduled suppository 30 min to 1 hour after meals to utilize  gastrocolic and colorectal reflexes.   1. Does the need for close, 24 hr/day medical supervision in concert with the patient's rehab needs make it unreasonable for this patient to be served in a less intensive setting? Yes 2. Co-Morbidities requiring supervision/potential complications: HTN (monitor and provide prns in accordance with increased physical exertion and pain), pain management (Biofeedback training with therapies to help reduce reliance on opiate pain medications, monitor pain control during therapies, and sedation at rest and titrate to maximum efficacy to ensure participation and gains in therapies), leukocytosis (cont to monitor for signs and symptoms of infection, further workup if indicated) 3. Due to bladder management, safety, skin/wound care, disease management, pain management and patient education, does the patient require 24 hr/day rehab nursing? Yes 4.  Does the patient require coordinated care of a physician, rehab nurse, PT (1-2 hrs/day, 5 days/week), OT (1-2 hrs/day, 5 days/week) and SLP (1-2 hrs/day, 5 days/week) to address physical and functional  deficits in the context of the above medical diagnosis(es)? Yes Addressing deficits in the following areas: balance, endurance, locomotion, strength, transferring, bathing, dressing, grooming, toileting, swallowing and psychosocial support 5. Can the patient actively participate in an intensive therapy program of at least 3 hrs of therapy per day at least 5 days per week? Yes 6. The potential for patient to make measurable gains while on inpatient rehab is excellent 7. Anticipated functional outcomes upon discharge from inpatient rehab are supervision and min assist  with PT, supervision with OT, independent with SLP. 8. Estimated rehab length of stay to reach the above functional goals is: 14-17 days. 9. Does the patient have adequate social supports and living environment to accommodate these discharge functional goals? Yes 10. Anticipated D/C setting: Home 11. Anticipated post D/C treatments: HH therapy and Home excercise program 12. Overall Rehab/Functional Prognosis: excellent  RECOMMENDATIONS: This patient's condition is appropriate for continued rehabilitative care in the following setting: [facility].  Will await surgery and therapy evaluation thereafter to determine goals. Patient has agreed to participate in recommended program. Yes Note that insurance prior authorization may be required for reimbursement for recommended care.  Comment: Rehab Admissions Coordinator to follow up.  Delice Lesch, MD 09/29/2015

## 2015-09-29 NOTE — Care Management Important Message (Signed)
Important Message  Patient Details  Name: Terri Bradley MRN: [id number] Date of Birth: 1927-07-28   Medicare Important Message Given:  Yes    Heidy Mccubbin 09/29/2015, 11:59 AM

## 2015-09-29 NOTE — Progress Notes (Signed)
Patient has attempted to void x 3 since retuning from surgery. Less that 30cc on each void attempt. Will continue to monitor. Hermina Barters Rn 09/29/2015 10:22 PM

## 2015-09-29 NOTE — Anesthesia Procedure Notes (Signed)
Procedure Name: Intubation Date/Time: 09/29/2015 4:07 PM Performed by: Sampson Si E Pre-anesthesia Checklist: Patient identified, Emergency Drugs available, Suction available, Patient being monitored and Timeout performed Patient Re-evaluated:Patient Re-evaluated prior to inductionOxygen Delivery Method: Circle system utilized Preoxygenation: Pre-oxygenation with 100% oxygen Intubation Type: IV induction Ventilation: Mask ventilation without difficulty Laryngoscope Size: Glidescope and 3 Grade View: Grade I Tube type: Oral Tube size: 7.0 mm Number of attempts: 1 Airway Equipment and Method: Stylet and Video-laryngoscopy Placement Confirmation: ETT inserted through vocal cords under direct vision,  positive ETCO2 and breath sounds checked- equal and bilateral Secured at: 21 cm Tube secured with: Tape Dental Injury: Teeth and Oropharynx as per pre-operative assessment  Difficulty Due To: Difficulty was anticipated, Difficult Airway- due to cervical collar and Difficult Airway-  due to neck instability Comments: Elective Glisescope use due to instability of C-spine injury. Neck stabilized and neutral throughout procedure.

## 2015-09-29 NOTE — Progress Notes (Signed)
PT Cancellation Note  Patient Details Name: TEJAL CLENDENON MRN: [id number] DOB: 03-07-1927   Cancelled Treatment:    Reason Eval/Treat Not Completed: Other (comment) PT orders discontinued per neurosurgery  as it looks as if pt is planning to have surgery and ACDF soon. Will follow up post surgery and await new orders.   Marguarite Arbour A Chey Cho 09/29/2015, 8:51 AM  Wray Kearns, PT, DPT [phone]

## 2015-09-29 NOTE — Op Note (Signed)
09/26/2015 - 09/29/2015  5:34 PM  PATIENT:  Terri Bradley  82 y.o. female  PRE-OPERATIVE DIAGNOSIS:  C6-7 fracture, instability, and spinal cord injury  POST-OPERATIVE DIAGNOSIS:  Same  PROCEDURE:  1. Decompressive anterior cervical discectomy C6-7, 2. Anterior cervical arthrodesis C6-7 utilizing a peek interbody cage packed with morselized autograft, 3. Anterior cervical plating C6-7 utilizing a nuvasive plate with 15 mm fixed angle screws  SURGEON:  Sherley Bounds, MD  ASSISTANTS: Dr. Annette Stable  ANESTHESIA:   General  EBL: Less than 25 ml  Total I/O In: 500 [I.V.:500] Out: -   BLOOD ADMINISTERED:none  DRAINS: None  SPECIMEN:  No Specimen  INDICATION FOR PROCEDURE: This patient presented after a fall. She had a central cord syndrome. MRI and CT scan showed minor fractures of C6-7 and signal change in the cord C6-7. Upright x-ray showed subluxation at C6-7. She was quite unstable. I recommended ACDF with plating to stabilize the C6-7 Bradley. Patient understood the risks, benefits, and alternatives and potential outcomes and wished to proceed.  PROCEDURE DETAILS: Patient was brought to the operating room placed under general endotracheal anesthesia. Patient was placed in the supine position on the operating room table. The neck was prepped with Duraprep and draped in a sterile fashion.   Three cc of local anesthesia was injected and a transverse incision was made on the right side of the neck.  Dissection was carried down thru the subcutaneous tissue and the platysma was  elevated, opened, and undermined with Metzenbaum scissors.  Dissection was then carried out thru an avascular plane leaving the sternocleidomastoid carotid artery and jugular vein laterally and the trachea and esophagus medially. The ventral aspect of the vertebral column was identified and a localizing x-ray was taken. The C6-7 Bradley was identified. The longus colli muscles were then elevated and the retractor was placed.  The annulus was disrupted but was incised and the disc space entered. Discectomy was performed with micro-curettes and pituitary rongeurs. I then used the high-speed drill to drill the endplates down to the Bradley of the posterior longitudinal ligament. The drill shavings were saved in a mucous trap for later arthrodesis. The operating microscope was draped and brought into the field provided additional magnification, illumination and visualization. Discectomy was continued posteriorly thru the disc space. Posterior longitudinal ligament was opened with a nerve hook, and found a disrupted ligament and atraumatic disc protrusion. This was then removed along with disc herniation and osteophytes, decompressing the spinal canal and thecal sac. We then continued to remove osteophytic overgrowth and disc material decompressing the neural foramina and exiting nerve roots bilaterally. The scope was angled up and down to help decompress and undercut the vertebral bodies. Once the decompression was completed we could pass a nerve hook circumferentially to assure adequate decompression in the midline and in the neural foramina. So by both visualization and palpation we felt we had an adequate decompression of the neural elements. We then measured the height of the intravertebral disc space and selected a 6 mm millimeter Peek interbody cage packed with autograft. It was then gently positioned in the intravertebral disc space and countersunk. I then used a 22 mm plate and placed four 15 mm fixed angle screws into the vertebral bodies and locked them into position. The wound was irrigated with bacitracin solution, checked for hemostasis which was established and confirmed. Once meticulous hemostasis was achieved, we then proceeded with closure. The platysma was closed with interrupted 3-0 undyed Vicryl suture, the subcuticular layer was closed  with interrupted 3-0 undyed Vicryl suture. The skin edges were approximated with  steristrips. The drapes were removed. A sterile dressing was applied. The patient was then awakened from general anesthesia and transferred to the recovery room in stable condition. At the end of the procedure all sponge, needle and instrument counts were correct.   PLAN OF CARE: Admit to inpatient   PATIENT DISPOSITION:  PACU - hemodynamically stable.   Delay start of Pharmacological VTE agent (>24hrs) due to surgical blood loss or risk of bleeding:  yes

## 2015-09-29 NOTE — Transfer of Care (Signed)
Immediate Anesthesia Transfer of Care Note  Patient: Terri Bradley  Procedure(s) Performed: Procedure(s): ANTERIOR CERVICAL DECOMPRESSION/DISCECTOMY CERVICAL SIX-SEVEN (N/A)  Patient Location: PACU  Anesthesia Type:General  Level of Consciousness: awake and patient cooperative  Airway & Oxygen Therapy: Patient Spontanous Breathing and Patient connected to nasal cannula oxygen  Post-op Assessment: Report given to RN, Patient moving all extremities X 4 and Patient able to stick tongue midline  Post vital signs: Reviewed and stable  Last Vitals:  Vitals:   09/29/15 0655 09/29/15 0912  BP: (!) 111/49 (!) 107/43  Pulse: (!) 59 93  Resp: 18 20  Temp: 36.7 C 36.7 C    Last Pain:  Vitals:   09/29/15 0912  TempSrc: Oral  PainSc:       Patients Stated Pain Goal: 1 ([id number])  Complications: No apparent anesthesia complications

## 2015-09-29 NOTE — Progress Notes (Signed)
Patient back in room 5C13. Alert, oriented, still c/o weakness, numbness and tingling in bilateral arms. Given ice chips. Will advance diet as tolerated. Aspen collar is on. Honeycomb dressing is clean, dry, and intact. Patient is DTV. Will continue to monitor.

## 2015-09-29 NOTE — Progress Notes (Signed)
Patient off unit for surgery. 

## 2015-09-29 NOTE — Care Management Note (Signed)
Case Management Note  Patient Details  Name: Terri Bradley MRN: [id number] Date of Birth: 1927/03/16  Subjective/Objective:   Pt admitted with contusion of the cervical cord. She is from home alone.                  Action/Plan: Plan is for her to go to OR today. PT/OT recommending CIR. CM following for d/c needs.   Expected Discharge Date:                  Expected Discharge Plan:  [place]  In-House Referral:     Discharge planning Services     Post Acute Care Choice:    Choice offered to:     DME Arranged:    DME Agency:     HH Arranged:    [place] Agency:     Status of Service:  In process, will continue to follow  If discussed at Long Length of Stay Meetings, dates discussed:    Additional Comments:  Pollie Friar, RN 09/29/2015, 12:09 PM

## 2015-09-29 NOTE — Progress Notes (Signed)
Patient ID: Terri Bradley, female   DOB: Jul 14, 1927, 82 y.o.   MRN: [id number] Subjective: Patient reports continued numbness in the hands. She is walking to the bathroom. She is in her cervical collar.  Objective: Vital signs in last 24 hours: Temp:  [97.7 F (36.5 C)-98.3 F (36.8 C)] 98.1 F (36.7 C) (08/28 0655) Pulse Rate:  [59-73] 59 (08/28 0655) Resp:  [18-20] 18 (08/28 0655) BP: (111-146)/(49-68) 111/49 (08/28 0655) SpO2:  [94 %-97 %] 97 % (08/28 0655)  Intake/Output from previous day: 08/27 0701 - 08/28 0700 In: 360 [P.O.:360] Out: -  Intake/Output this shift: No intake/output data recorded.  Handgrips remain 4 minus out of 5 in triceps are weak. Otherwise seems to have good strength and looks comfortable  Lab Results: Lab Results  Component Value Date   WBC 10.8 (H) 09/26/2015   HGB 13.0 09/26/2015   HCT 39.3 09/26/2015   MCV 89.7 09/26/2015   PLT 286 09/26/2015   Lab Results  Component Value Date   INR 1.01 09/26/2015   BMET Lab Results  Component Value Date   NA 138 09/26/2015   K 3.7 09/26/2015   CL 104 09/26/2015   CO2 21 (L) 09/26/2015   GLUCOSE 121 (H) 09/26/2015   BUN 15 09/26/2015   CREATININE 0.78 09/26/2015   CALCIUM 9.5 09/26/2015    Studies/Results: Dg Cervical Spine 2 Or 3 Views  Result Date: 09/28/2015 CLINICAL DATA:  Cervical clearing.  Spinal cord injury. EXAM: CERVICAL SPINE - 2-3 VIEW COMPARISON:  MRI 09/26/2015 FINDINGS: The previously seen fracture at C7 not well visualized by plain film. There is slight anterolisthesis of C6 on C7, approximately 5 mm. Degenerative disc disease in the mid to lower cervical spine. IMPRESSION: Unable to visualize the previously seen C7 fracture. Grade 1 anterolisthesis of C6 on C7. Spondylosis. Electronically Signed   By: Rolm Baptise M.D.   On: 09/28/2015 08:15   Dg Cervical Spine With Flex & Extend  Result Date: 09/28/2015 CLINICAL DATA:  C7 cervical fracture. EXAM: CERVICAL SPINE COMPLETE WITH  FLEXION AND EXTENSION VIEWS COMPARISON:  CT of the cervix spine 09/28/2015 FINDINGS: There is a known C7 vertebral body fracture. There is a 4.5 mm anterior listhesis of C6 on C7 in neutral position, which increases to a 9 mm with flexion and 7 mm with extension. Multilevel osteoarthritic changes of the cervical spine as seen. No prevertebral soft tissue thickening. IMPRESSION: Known C7 vertebral body fracture which shows instability on flexion and extension with increasing anterior listhesis of C6 on C7. These results will be called to the ordering clinician or representative by the Radiologist Assistant, and communication documented in the PACS or zVision Dashboard. Electronically Signed   By: Fidela Salisbury M.D.   On: 09/28/2015 16:38   Ct Cervical Spine Wo Contrast  Result Date: 09/28/2015 CLINICAL DATA:  Fall backwards down escalator. Neck pain. C7 fracture. EXAM: CT CERVICAL SPINE WITHOUT CONTRAST TECHNIQUE: Multidetector CT imaging of the cervical spine was performed without intravenous contrast. Multiplanar CT image reconstructions were also generated. COMPARISON:  Plain films 09/28/2015. Cervical spine CT 09/26/2015. MRI 09/26/2014. FINDINGS: Again noted is the fracture through the instead anterior superior corner of C7 as seen on prior CT and MRI. Fractures also noted through the posterior elements on the left at C7 involving the left superior facet. This is unchanged since prior study. 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 No additional fracture noted. Alignment is normal. IMPRESSION: Again noted are fractures through  the anterior superior endplate at C7 and the left C7 superior facet. Findings stable since prior studies. Electronically Signed   By: Rolm Baptise M.D.   On: 09/28/2015 14:10    Assessment/Plan: Unfortunately, she has significant instability when upright and in flexion and extension at C6-7. This completely reduces in her supine CT scan.  Therefore I have recommended surgical stabilization in the form of ACDF with plating at C6-7 with fixed screws. I considered posterior stabilization also. I think a front back approach is too much surgery for her at this time. I have spoken to a couple of my partners and we all agreed an anterior approach is reasonable.  I have recommended ACDF with plating at C6-7. She understands the risk of the surgery include but are not limited to bleeding, infection, nerve injury, spinal cord injury, weakness, numbness, paralysis, hoarseness, trouble swallowing, failure of the construct, need for further surgery, lack of relief of symptoms, worsening symptoms, recurrent laryngeal nerve injury, esophageal injury, tracheal injury, stroke, carotid or vertebral artery injury, pseudoarthrosis, and anesthesia risk including DVT pneumonia a.m. I and death. She agrees to proceed.   LOS: 3 days    Terri Bradley S 09/29/2015, 8:03 AM

## 2015-09-29 NOTE — Anesthesia Preprocedure Evaluation (Addendum)
Anesthesia Evaluation  Patient identified by MRN, date of birth, ID band Patient awake    Reviewed: Allergy & Precautions, NPO status , Patient's Chart, lab work & pertinent test results  Airway Mallampati: III  TM Distance: >3 FB Neck ROM: Full   Comment: Cervical collar  Dental no notable dental hx. (+) Teeth Intact, Dental Advisory Given,    Pulmonary neg pulmonary ROS,    Pulmonary exam normal        Cardiovascular hypertension, Pt. on medications Normal cardiovascular exam     Neuro/Psych negative neurological ROS     GI/Hepatic negative GI ROS, Neg liver ROS,   Endo/Other  negative endocrine ROS  Renal/GU negative Renal ROS  negative genitourinary   Musculoskeletal   Abdominal   Peds  Hematology   Anesthesia Other Findings   Reproductive/Obstetrics                          Anesthesia Physical Anesthesia Plan  ASA: II  Anesthesia Plan: General   Post-op Pain Management:    Induction: Intravenous  Airway Management Planned: Oral ETT and Video Laryngoscope Planned  Additional Equipment:   Intra-op Plan:   Post-operative Plan: Extubation in OR  Informed Consent: I have reviewed the patients History and Physical, chart, labs and discussed the procedure including the risks, benefits and alternatives for the proposed anesthesia with the patient or authorized representative who has indicated his/her understanding and acceptance.   Dental advisory given  Plan Discussed with:   Anesthesia Plan Comments:         Anesthesia Quick Evaluation Video laryngoscopy with cervical neck stabilization

## 2015-09-29 NOTE — Progress Notes (Signed)
OT Cancellation Note  Patient Details Name: TULLY BOAKYE MRN: [id number] DOB: 1927-07-16   Cancelled Treatment:    Reason Eval/Treat Not Completed: Other (comment). OT orders discontinued per neurosurgery MD; seems as if pt is to have surgery/ACDF soon. Will follow up post surgery and await new orders.    Emmit Alexanders [facility] 09/29/2015, 9:34 AM

## 2015-09-29 NOTE — Progress Notes (Signed)
Pt has gone for surgery. I will begin insurance authorization for a possible inpt rehab admission pending insurance approval and pt agreement. I will meet with pt tomorrow to discuss. [id number]

## 2015-09-30 ENCOUNTER — Encounter (HOSPITAL_COMMUNITY): Payer: Self-pay | Admitting: Neurological Surgery

## 2015-09-30 MED ORDER — SODIUM CHLORIDE 0.9 % IV SOLN
INTRAVENOUS | Status: AC
  Administered 2015-09-30: 02:00:00 via INTRAVENOUS

## 2015-09-30 NOTE — Progress Notes (Signed)
I met with pt and her daughter at bedside to discuss rehab admission and goals. They prefer inpt rehab rather than SNF. I await insurance approval to admit hopefully Wednesday. [phone]

## 2015-09-30 NOTE — Progress Notes (Signed)
Physical Therapy Treatment Patient Details Name: Terri Bradley MRN: [id number] DOB: 08/11/1927 Today's Date: 09/30/2015    History of Present Illness Admitted after fall on escalator at a convention, resulting in Terri Bradley spinal cord contusion; motor return to UEs and LEs, still tingling bil UEs; opting for non-operative management; Collar at all times; Pt did end up undergoing a ACDF on 8/28.  Collar at all times.     PT Comments    Progressing steadily.  Still with gait instability and coordination issues with UE's.  Emphasized transitions, standing activity and gait stability.  Follow Up Recommendations  CIR     Equipment Recommendations  Rolling walker with 5" wheels;3in1 (PT)    Recommendations for Other Services Rehab consult     Precautions / Restrictions Precautions Precautions: Fall;Cervical Required Braces or Orthoses: Cervical Brace Cervical Brace: Hard collar;At all times Restrictions Weight Bearing Restrictions: No    Mobility  Bed Mobility Overal bed mobility: Needs Assistance Bed Mobility: Rolling;Sidelying to Sit Rolling: Min assist Sidelying to sit: Mod assist       General bed mobility comments: Assist for elevation of trunk to sitting. Pt with L shoulder pain during bed mobility.  Transfers Overall transfer level: Needs assistance Equipment used: 2 person hand held assist Transfers: Sit to/from Stand Sit to Stand: Mod assist Stand pivot transfers: Mod assist       General transfer comment: Pt with increased independence today with transfers.  Two people there for safety but only needed 1 person assist for most of session.  Ambulation/Gait Ambulation/Gait assistance: Min assist Ambulation Distance (Feet): 80 Feet Assistive device: Rolling walker (2 wheeled) Gait Pattern/deviations: Step-through pattern Gait velocity: slower Gait velocity interpretation: at or above normal speed for age/gender General Gait Details: mildly unsteady  overall. Uncoordinated advance of L LE and appears less confident weightbearing in the R LE   Stairs            Wheelchair Mobility    Modified Rankin (Stroke Patients Only)       Balance Overall balance assessment: Needs assistance Sitting-balance support: Bilateral upper extremity supported;Feet supported Sitting balance-Leahy Scale: Fair     Standing balance support: Bilateral upper extremity supported;During functional activity Standing balance-Leahy Scale: Poor Standing balance comment: reliant on the RW                    Cognition Arousal/Alertness: Awake/alert Behavior During Therapy: [facility] for tasks assessed/performed;Flat affect Overall Cognitive Status: Within Functional Limits for tasks assessed                 General Comments: Pt appears better cognitively today compared to 8/26.  Pt very motivated but limited in her UEs due to the nature of central cord injury.  Pt requires a great amount of assist for basic adls at this point but is motivated to work hard and increase her independence. Pt provided with built up handles to assist in feeding herself.  Pt can use a fork and spoon for short periods of time without the handles but does fatigue quickly so equipment provided to use at the end of meals to increase independence.      Exercises Other Exercises Other Exercises: For today, pt provided with light resistance putty and given basic hand and finger exercises to do.  Will provide written fine motor exercise routine.    General Comments        Pertinent Vitals/Pain Pain Assessment: Faces Faces Pain Scale: Hurts little more Pain Location:  neck Pain Descriptors / Indicators: Operative site guarding;Guarding Pain Intervention(s): Monitored during session;Repositioned    Home Living Family/patient expects to be discharged to:: Inpatient rehab Living Arrangements: Alone Available Help at Discharge: Friend(s);Available PRN/intermittently Type  of Home: House Home Access: Stairs to enter   Home Layout: One level Home Equipment: Environmental consultant - 2 wheels;Bedside commode;Shower seat      Prior Function Level of Independence: Independent          PT Goals (current goals can now be found in the care plan section) Acute Rehab PT Goals Patient Stated Goal: be more independent PT Goal Formulation: With patient Time For Goal Achievement: 10/11/15 Potential to Achieve Goals: Good Progress towards PT goals: Progressing toward goals    Frequency  Min 4X/week    PT Plan Current plan remains appropriate    Co-evaluation   Reason for Co-Treatment: Complexity of the patient's impairments (multi-system involvement) PT goals addressed during session: Mobility/safety with mobility OT goals addressed during session: ADL's and self-care     End of Session Equipment Utilized During Treatment: Gait belt Activity Tolerance: Patient tolerated treatment well Patient left: in chair;with call bell/phone within reach;with family/visitor present     Time: 1010-1050 PT Time Calculation (min) (ACUTE ONLY): 40 min  Charges:  $Gait Training: 8-22 mins $Therapeutic Activity: 8-22 mins                    G Codes:      Terri Bradley, Terri Bradley 09/30/2015, 12:04 PM 09/30/2015  Terri Bradley, [place] [phone]  (pager)

## 2015-09-30 NOTE — Progress Notes (Signed)
Occupational Therapy Treatment Patient Details Name: Terri Bradley MRN: [id number] DOB: Oct 27, 1927 Today's Date: 09/30/2015    History of present illness Admitted after fall on escalator at a convention, resulting in [place] spinal cord contusion; motor return to UEs and LEs, still tingling bil UEs; opting for non-operative management; Collar at all times; Pt did end up undergoing a ACDF on 8/28.  Collar at all times.    OT comments  Pt seen after ACDF surgery today but goals remain the same.  Pt seems to have less pain shoulder compared to yesterday.  Pt continues to require significant assist for most adls due to arm and hand weakness and numbness as well as being uncoordinated in BLEs when ambulating.  Today, pt only needed +1 assist for most adls and adl mobility and tolerated therapy well.  Pt lives alone but has supportive daughter.  Feel pt would benefit from further intense rehab to maximize her independence with all adls so she can return home with PRN assist from her daughter.    Follow Up Recommendations  CIR;Supervision/Assistance - 24 hour    Equipment Recommendations       Recommendations for Other Services      Precautions / Restrictions Precautions Precautions: Fall;Cervical Required Braces or Orthoses: Cervical Brace Cervical Brace: Hard collar;At all times Restrictions Weight Bearing Restrictions: No       Mobility Bed Mobility Overal bed mobility: Needs Assistance Bed Mobility: Rolling;Sidelying to Sit Rolling: Min assist Sidelying to sit: Mod assist       General bed mobility comments: Assist for elevation of trunk to sitting. Pt with L shoulder pain during bed mobility.  Transfers Overall transfer level: Needs assistance Equipment used: 2 person hand held assist Transfers: Sit to/from Stand Sit to Stand: Mod assist Stand pivot transfers: Mod assist       General transfer comment: Pt with increased independence today with transfers.  Two  people there for safety but only needed 1 person assist for most of session.    Balance Overall balance assessment: Needs assistance Sitting-balance support: Bilateral upper extremity supported;Feet supported Sitting balance-Leahy Scale: Fair     Standing balance support: Bilateral upper extremity supported;During functional activity Standing balance-Leahy Scale: Poor Standing balance comment: Pt must have assistive device to remain standing.                   ADL Overall ADL's : Needs assistance/impaired Eating/Feeding: Minimal assistance;Sitting Eating/Feeding Details (indicate cue type and reason): Pt only requires min assist at the end of meal when she becomes tired.  Built up handles will assist with this.  Pt unable to cut food at this time so set up required.  A rocker knife may be helpful if pt does not increase strength. Grooming: Minimal assistance;Standing Grooming Details (indicate cue type and reason): Pt stood at sink for 4 minutes with min assist for balance and min assist to brush teeth.  Pt dropped toothbrush and paste several times but was able to finish task with min assist. Upper Body Bathing: Sitting;Minimal assitance Upper Body Bathing Details (indicate cue type and reason): Pt unable to squeeze out the washcloth fully with strength at 3+/5 in grip. Pt drops washcloth frequently.  Feel she can do without adaptive washcloth at this time to challenge her to hold to normal cloth. Lower Body Bathing: Maximal assistance;Sit to/from stand Lower Body Bathing Details (indicate cue type and reason): Pt was able to cross lower legs this am to bathe past her knee  but still unable to reach feet without assist.   Upper Body Dressing : Moderate assistance;Sitting Upper Body Dressing Details (indicate cue type and reason): Pt requires total assist at this time to donn bra; unable to clasp or put over head due to weakness.  Pt can donn loose fitting shirt but unable to manage  fasteners.  May need button hook; will see how pt progresses. Lower Body Dressing: Maximal assistance;Sit to/from stand Lower Body Dressing Details (indicate cue type and reason): Pt doffed socks with min assist today to hold leg crossed over so she could reach socks. Pt requires total assist to tie shoes and manage fasteners.  Toilet Transfer: Moderate assistance;Comfort height toilet;Grab bars;RW Toilet Transfer Details (indicate cue type and reason): Pt walked to bathroom with walker. Pt with incoordination of BLEs when ambulating. See PT notes. Toileting- Clothing Manipulation and Hygiene: Maximal assistance;Sitting/lateral lean Toileting - Clothing Manipulation Details (indicate cue type and reason): Pt can clean self after urinating but does not have strength or coordiation to do so after bowel movement.     Functional mobility during ADLs: Moderate assistance;Rolling walker General ADL Comments: Pt struggles with adls but very motivated to be independent.  Pt realized she could do more than she thought at beginning of session when she tried new things.  She was able to do most adls with mod assist and grooming/feeding with min assist.       Vision                     Perception     Praxis      Cognition   Behavior During Therapy: [facility] for tasks assessed/performed;Flat affect Overall Cognitive Status: Within Functional Limits for tasks assessed                  General Comments: Pt appears better cognitively today compared to 8/26.  Pt very motivated but limited in her UEs due to the nature of central cord injury.  Pt requires a great amount of assist for basic adls at this point but is motivated to work hard and increase her independence. Pt provided with built up handles to assist in feeding herself.  Pt can use a fork and spoon for short periods of time without the handles but does fatigue quickly so equipment provided to use at the end of meals to increase  independence.      Extremity/Trunk Assessment  Upper Extremity Assessment Upper Extremity Assessment: RUE deficits/detail;LUE deficits/detail            Exercises Other Exercises Other Exercises: For today, pt provided with light resistance putty and given basic hand and finger exercises to do.  Will provide written fine motor exercise routine.   Shoulder Instructions       General Comments      Pertinent Vitals/ Pain       Pain Assessment: Faces Faces Pain Scale: Hurts little more Pain Location: neck Pain Descriptors / Indicators: Operative site guarding Pain Intervention(s): Monitored during session;Repositioned;Other (comment) (tightened neck brace.)  Home Living Family/patient expects to be discharged to:: Inpatient rehab Living Arrangements: Alone Available Help at Discharge: Friend(s);Available PRN/intermittently Type of Home: House Home Access: Stairs to enter Entrance Stairs-Number of Steps: 1   Home Layout: One level     Bathroom Shower/Tub: Tub/shower unit;Walk-in shower Shower/tub characteristics: Architectural technologist: Standard     Home Equipment: Environmental consultant - 2 wheels;Bedside commode;Shower seat          Prior Functioning/Environment  Level of Independence: Independent            Frequency Min 3X/week     Progress Toward Goals  OT Goals(current goals can now be found in the care plan section)  Progress towards OT goals: Progressing toward goals  Acute Rehab OT Goals Patient Stated Goal: be more independent OT Goal Formulation: With patient Time For Goal Achievement: 10/11/15 Potential to Achieve Goals: Good ADL Goals Pt Will Perform Eating: with set-up;sitting Pt Will Perform Grooming: with min guard assist;standing Pt Will Perform Upper Body Bathing: with supervision;sitting Pt Will Perform Lower Body Bathing: with min guard assist;sit to/from stand Pt Will Transfer to Toilet: with min guard assist;ambulating;bedside commode Pt Will  Perform Toileting - Clothing Manipulation and hygiene: with min guard assist;sit to/from stand Pt/caregiver will Perform Home Exercise Program: Increased strength;Both right and left upper extremity;Independently;With written HEP provided  Plan Discharge plan remains appropriate    Co-evaluation    PT/OT/SLP Co-Evaluation/Treatment: Yes Reason for Co-Treatment: For patient/therapist safety;Complexity of the patient's impairments (multi-system involvement) PT goals addressed during session: Mobility/safety with mobility OT goals addressed during session: ADL's and self-care      End of Session Equipment Utilized During Treatment: Cervical collar   Activity Tolerance Patient tolerated treatment well   Patient Left in chair;with call bell/phone within reach;with chair alarm set;with family/visitor present   Nurse Communication Mobility status        Time: 1010-1100 OT Time Calculation (min): 50 min  Charges: OT General Charges $OT Visit: 1 Procedure OT Treatments $Self Care/Home Management : 8-22 mins $Therapeutic Exercise: 8-22 mins  Glenford Peers 09/30/2015, 11:31 AM  [phone]

## 2015-09-30 NOTE — Progress Notes (Signed)
I met with pt at bedside to discuss a possible inpt rehab admission pending insurance approval. Pt states she has obtained a lawyer for she and two other [place]. I will return today upon arrival of her daughter to discuss rehab venue. [phone]

## 2015-09-30 NOTE — Progress Notes (Signed)
Patient ID: Terri Bradley, female   DOB: 09-Feb-1927, 82 y.o.   MRN: [id number] Subjective: Patient reports no neck pain. Hand numbness unchanged. She walked in the hall early this morning.  Objective: Vital signs in last 24 hours: Temp:  [97.7 F (36.5 C)-98.2 F (36.8 C)] 98.1 F (36.7 C) (08/29 0541) Pulse Rate:  [56-93] 77 (08/29 0541) Resp:  [14-20] 18 (08/29 0541) BP: (93-153)/(40-67) 118/40 (08/29 0541) SpO2:  [92 %-99 %] 92 % (08/29 0541)  Intake/Output from previous day: 08/28 0701 - 08/29 0700 In: 1469.3 [P.O.:240; I.V.:1179.3; IV Piggyback:50] Out: 2400 [Urine:2350; Blood:50] Intake/Output this shift: No intake/output data recorded.  Awake and alert, collar in place, dressing dry, grips 4 minus out of 5, triceps 4 minus out of 5, the remainder of her strength remains good  Lab Results: Lab Results  Component Value Date   WBC 10.8 (H) 09/26/2015   HGB 13.0 09/26/2015   HCT 39.3 09/26/2015   MCV 89.7 09/26/2015   PLT 286 09/26/2015   Lab Results  Component Value Date   INR 1.01 09/26/2015   BMET Lab Results  Component Value Date   NA 138 09/26/2015   K 3.7 09/26/2015   CL 104 09/26/2015   CO2 21 (L) 09/26/2015   GLUCOSE 121 (H) 09/26/2015   BUN 15 09/26/2015   CREATININE 0.78 09/26/2015   CALCIUM 9.5 09/26/2015    Studies/Results: Dg Cervical Spine 2-3 Views  Result Date: 09/29/2015 CLINICAL DATA:  Status post anterior fusion at C6 and C7 EXAM: DG C-ARM 61-120 MIN; CERVICAL SPINE - 2-3 VIEW COMPARISON:  September 28, 2015 FLUOROSCOPY TIME:  0 minutes 13 seconds; 2 acquired images FINDINGS: Frontal and lateral views were obtained. There is screw and plate fixation anteriorly at C6 and C7 with support hardware and disc spacer at C6-7 intact. No fracture or spondylolisthesis. There is moderate disc space narrowing at C5-6. There are anterior osteophytes at C4 and C5. IMPRESSION: Status post anterior screw and plate fixation at C6 and C7 with alignment anatomic  in this area. Moderate disc space narrowing C5-6. Anterior osteophytes at C4 and C5. No acute fracture or spondylolisthesis. Electronically Signed   By: Lowella Grip III M.D.   On: 09/29/2015 17:36   Dg Cervical Spine With Flex & Extend  Result Date: 09/28/2015 CLINICAL DATA:  C7 cervical fracture. EXAM: CERVICAL SPINE COMPLETE WITH FLEXION AND EXTENSION VIEWS COMPARISON:  CT of the cervix spine 09/28/2015 FINDINGS: There is a known C7 vertebral body fracture. There is a 4.5 mm anterior listhesis of C6 on C7 in neutral position, which increases to a 9 mm with flexion and 7 mm with extension. Multilevel osteoarthritic changes of the cervical spine as seen. No prevertebral soft tissue thickening. IMPRESSION: Known C7 vertebral body fracture which shows instability on flexion and extension with increasing anterior listhesis of C6 on C7. These results will be called to the ordering clinician or representative by the Radiologist Assistant, and communication documented in the PACS or zVision Dashboard. Electronically Signed   By: Fidela Salisbury M.D.   On: 09/28/2015 16:38   Ct Cervical Spine Wo Contrast  Result Date: 09/28/2015 CLINICAL DATA:  Fall backwards down escalator. Neck pain. C7 fracture. EXAM: CT CERVICAL SPINE WITHOUT CONTRAST TECHNIQUE: Multidetector CT imaging of the cervical spine was performed without intravenous contrast. Multiplanar CT image reconstructions were also generated. COMPARISON:  Plain films 09/28/2015. Cervical spine CT 09/26/2015. MRI 09/26/2014. FINDINGS: Again noted is the fracture through the instead anterior superior corner of  C7 as seen on prior CT and MRI. Fractures also noted through the posterior elements on the left at C7 involving the left superior facet. This is unchanged since prior study. 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 No additional fracture noted. Alignment is normal. IMPRESSION: Again noted are fractures through the  anterior superior endplate at C7 and the left C7 superior facet. Findings stable since prior studies. Electronically Signed   By: Rolm Baptise M.D.   On: 09/28/2015 14:10   Dg C-arm 1-60 Min  Result Date: 09/29/2015 CLINICAL DATA:  Status post anterior fusion at C6 and C7 EXAM: DG C-ARM 61-120 MIN; CERVICAL SPINE - 2-3 VIEW COMPARISON:  September 28, 2015 FLUOROSCOPY TIME:  0 minutes 13 seconds; 2 acquired images FINDINGS: Frontal and lateral views were obtained. There is screw and plate fixation anteriorly at C6 and C7 with support hardware and disc spacer at C6-7 intact. No fracture or spondylolisthesis. There is moderate disc space narrowing at C5-6. There are anterior osteophytes at C4 and C5. IMPRESSION: Status post anterior screw and plate fixation at C6 and C7 with alignment anatomic in this area. Moderate disc space narrowing C5-6. Anterior osteophytes at C4 and C5. No acute fracture or spondylolisthesis. Electronically Signed   By: Lowella Grip III M.D.   On: 09/29/2015 17:36    Assessment/Plan: Seems to be doing okay. Rehabilitation consult. Mobilize.   LOS: 4 days    JONES,DAVID S 09/30/2015, 7:53 AM

## 2015-09-30 NOTE — Progress Notes (Signed)
Pt still unable to void, bladder scan for 611 mL at 1445.  In and Out cath performed and removed 800 mL.  Pt and family educated on protocol and informed that after the 6 hour window if the patient was still unable to void, a foley catheter would be inserted.  Will continue to monitor.  Cori Razor, RN

## 2015-09-30 NOTE — Progress Notes (Signed)
Paged on call doctor due to patients bp of 93/40, which has been steadily declining throughout the night...Dr. Annette Stable gave verbal order for 500cc normal saline...administering now....will continue to monitor the patient.

## 2015-09-30 NOTE — Progress Notes (Signed)
Bladder scan performed on patient. Scan showed a volume of 720. Cath performed per doctors order. Output volume 900. Patient tolerated cath well. Patient is resting in bed. Hermina Barters Rn, 09/30/2015 12:07 AM

## 2015-10-01 ENCOUNTER — Encounter (HOSPITAL_COMMUNITY): Payer: Self-pay | Admitting: Physical Medicine and Rehabilitation

## 2015-10-01 ENCOUNTER — Inpatient Hospital Stay (HOSPITAL_COMMUNITY)
Admission: RE | Admit: 2015-10-01 | Discharge: 2015-10-09 | DRG: 949 | Disposition: A | Discharge status: Home Health | Payer: Medicare Other | Source: Intra-hospital | Attending: Physical Medicine & Rehabilitation | Admitting: Physical Medicine & Rehabilitation

## 2015-10-01 DIAGNOSIS — W100XXD Fall (on)(from) escalator, subsequent encounter: Secondary | ICD-10-CM | POA: Diagnosis not present

## 2015-10-01 DIAGNOSIS — I10 Essential (primary) hypertension: Secondary | ICD-10-CM | POA: Diagnosis not present

## 2015-10-01 DIAGNOSIS — D62 Acute posthemorrhagic anemia: Secondary | ICD-10-CM | POA: Diagnosis not present

## 2015-10-01 DIAGNOSIS — G8918 Other acute postprocedural pain: Secondary | ICD-10-CM

## 2015-10-01 DIAGNOSIS — R269 Unspecified abnormalities of gait and mobility: Secondary | ICD-10-CM | POA: Diagnosis not present

## 2015-10-01 DIAGNOSIS — N319 Neuromuscular dysfunction of bladder, unspecified: Secondary | ICD-10-CM | POA: Diagnosis not present

## 2015-10-01 DIAGNOSIS — Z6827 Body mass index (BMI) 27.0-27.9, adult: Secondary | ICD-10-CM

## 2015-10-01 DIAGNOSIS — K592 Neurogenic bowel, not elsewhere classified: Secondary | ICD-10-CM

## 2015-10-01 DIAGNOSIS — K5900 Constipation, unspecified: Secondary | ICD-10-CM | POA: Diagnosis not present

## 2015-10-01 DIAGNOSIS — E46 Unspecified protein-calorie malnutrition: Secondary | ICD-10-CM | POA: Diagnosis not present

## 2015-10-01 DIAGNOSIS — S14126S Central cord syndrome at C6 level of cervical spinal cord, sequela: Secondary | ICD-10-CM | POA: Diagnosis not present

## 2015-10-01 DIAGNOSIS — Z981 Arthrodesis status: Secondary | ICD-10-CM

## 2015-10-01 DIAGNOSIS — S12600D Unspecified displaced fracture of seventh cervical vertebra, subsequent encounter for fracture with routine healing: Secondary | ICD-10-CM | POA: Diagnosis not present

## 2015-10-01 DIAGNOSIS — M792 Neuralgia and neuritis, unspecified: Secondary | ICD-10-CM | POA: Diagnosis not present

## 2015-10-01 DIAGNOSIS — R339 Retention of urine, unspecified: Secondary | ICD-10-CM

## 2015-10-01 DIAGNOSIS — S14126A Central cord syndrome at C6 level of cervical spinal cord, initial encounter: Secondary | ICD-10-CM | POA: Diagnosis present

## 2015-10-01 DIAGNOSIS — Z79899 Other long term (current) drug therapy: Secondary | ICD-10-CM

## 2015-10-01 DIAGNOSIS — S14126D Central cord syndrome at C6 level of cervical spinal cord, subsequent encounter: Secondary | ICD-10-CM | POA: Diagnosis present

## 2015-10-01 LAB — URINALYSIS, ROUTINE W REFLEX MICROSCOPIC
BILIRUBIN URINE: NEGATIVE
GLUCOSE, UA: NEGATIVE mg/dL
Hgb urine dipstick: NEGATIVE
KETONES UR: 15 mg/dL — AB
Leukocytes, UA: NEGATIVE
Nitrite: NEGATIVE
PH: 6.5 (ref 5.0–8.0)
Protein, ur: NEGATIVE mg/dL
Specific Gravity, Urine: 1.022 (ref 1.005–1.030)

## 2015-10-01 MED ORDER — ALUM & MAG HYDROXIDE-SIMETH 200-200-20 MG/5ML PO SUSP: 30.0000 mL | ORAL | Status: DC | PRN

## 2015-10-01 MED ORDER — PHENOL 1.4 % MT LIQD: 1.0000 | OROMUCOSAL | Status: DC | PRN

## 2015-10-01 MED ORDER — BISACODYL 10 MG RE SUPP: 10.0000 mg | Freq: Every day | RECTAL | Status: DC | PRN

## 2015-10-01 MED ORDER — PROCHLORPERAZINE 25 MG RE SUPP: 12.5000 mg | Freq: Four times a day (QID) | RECTAL | Status: DC | PRN

## 2015-10-01 MED ORDER — PROCHLORPERAZINE MALEATE 5 MG PO TABS: 5.0000 mg | ORAL_TABLET | Freq: Four times a day (QID) | ORAL | Status: DC | PRN

## 2015-10-01 MED ORDER — MENTHOL 3 MG MT LOZG
1.0000 | LOZENGE | OROMUCOSAL | Status: DC | PRN
  Filled 2015-10-01: qty 9

## 2015-10-01 MED ORDER — PROCHLORPERAZINE EDISYLATE 5 MG/ML IJ SOLN: 5.0000 mg | Freq: Four times a day (QID) | INTRAMUSCULAR | Status: DC | PRN

## 2015-10-01 MED ORDER — LISINOPRIL 20 MG PO TABS
20.0000 mg | ORAL_TABLET | Freq: Every day | ORAL | Status: DC
  Administered 2015-10-02 – 2015-10-09 (×8): 20 mg via ORAL
  Filled 2015-10-01 (×8): qty 1

## 2015-10-01 MED ORDER — SENNA 8.6 MG PO TABS
1.0000 | ORAL_TABLET | Freq: Every day | ORAL | Status: DC
  Administered 2015-10-01 – 2015-10-07 (×4): 8.6 mg via ORAL
  Filled 2015-10-01 (×6): qty 1

## 2015-10-01 MED ORDER — GABAPENTIN 100 MG PO CAPS
100.0000 mg | ORAL_CAPSULE | Freq: Every day | ORAL | Status: DC
  Administered 2015-10-01 – 2015-10-08 (×8): 100 mg via ORAL
  Filled 2015-10-01 (×8): qty 1

## 2015-10-01 MED ORDER — ADULT MULTIVITAMIN W/MINERALS CH
1.0000 | ORAL_TABLET | Freq: Every evening | ORAL | Status: DC
  Administered 2015-10-01 – 2015-10-08 (×8): 1 via ORAL
  Filled 2015-10-01 (×8): qty 1

## 2015-10-01 MED ORDER — VITAMIN D 1000 UNITS PO TABS
2000.0000 [IU] | ORAL_TABLET | Freq: Every day | ORAL | Status: DC
  Administered 2015-10-01 – 2015-10-09 (×9): 2000 [IU] via ORAL
  Filled 2015-10-01 (×9): qty 2

## 2015-10-01 MED ORDER — GUAIFENESIN-DM 100-10 MG/5ML PO SYRP: 5.0000 mL | ORAL_SOLUTION | Freq: Four times a day (QID) | ORAL | Status: DC | PRN

## 2015-10-01 MED ORDER — DIPHENHYDRAMINE HCL 12.5 MG/5ML PO ELIX: 12.5000 mg | ORAL_SOLUTION | Freq: Four times a day (QID) | ORAL | Status: DC | PRN

## 2015-10-01 MED ORDER — ACETAMINOPHEN 325 MG PO TABS: 325.0000 mg | ORAL_TABLET | ORAL | Status: DC | PRN

## 2015-10-01 MED ORDER — FLEET ENEMA 7-19 GM/118ML RE ENEM: 1.0000 | ENEMA | Freq: Once | RECTAL | Status: DC | PRN

## 2015-10-01 MED ORDER — HYDROCODONE-ACETAMINOPHEN 5-325 MG PO TABS
1.0000 | ORAL_TABLET | ORAL | Status: DC | PRN
  Administered 2015-10-01 – 2015-10-02 (×3): 2 via ORAL
  Administered 2015-10-02: 1 via ORAL
  Filled 2015-10-01 (×3): qty 2
  Filled 2015-10-01 (×2): qty 1
  Filled 2015-10-01 (×2): qty 2

## 2015-10-01 MED ORDER — HYDROCHLOROTHIAZIDE 25 MG PO TABS
25.0000 mg | ORAL_TABLET | Freq: Every day | ORAL | Status: DC
  Administered 2015-10-02 – 2015-10-09 (×8): 25 mg via ORAL
  Filled 2015-10-01 (×8): qty 1

## 2015-10-01 MED ORDER — LIDOCAINE HCL 2 % EX GEL: CUTANEOUS | Status: DC | PRN

## 2015-10-01 MED ORDER — TRAZODONE HCL 50 MG PO TABS
25.0000 mg | ORAL_TABLET | Freq: Every evening | ORAL | Status: DC | PRN
  Administered 2015-10-02 – 2015-10-08 (×7): 50 mg via ORAL
  Filled 2015-10-01 (×7): qty 1

## 2015-10-01 NOTE — Discharge Summary (Signed)
Physician Discharge Summary  Patient ID: Terri Bradley MRN: [id number] DOB/AGE: [birth year] 82 y.o.  Admit date: 09/26/2015 Discharge date: 10/01/2015  Admission Diagnoses: central cord syndrome    Discharge Diagnoses: same, ACDF C6-7   Discharged Condition: stable  Hospital Course: The patient was admitted on 09/26/2015 after a fall. She was diagnosed with a central cord syndrome. Her initial CT and MRI showed no subluxation at C6-7, but upright x-rays showed significant sublux at C6-7. This was suggestive of significant instability. Therefore she was taken to the operating room where the patient underwent ACDF with plating at C6-7. The patient tolerated the procedure well and was taken to the recovery room and then to the floor in stable condition. The hospital course was routine. There were no complications. The wound remained clean dry and intact. Pt had appropriate neck soreness. No complaints of arm pain or new N/T/W, though her numb and weak hands and triceps remained as expected. The patient remained afebrile with stable vital signs, and tolerated a regular diet. The patient continued to increase activities, and pain was well controlled with oral pain medications. She was admitted to the inpatient rehabilitation facility today.  Consults: rehabilitation medicine  Significant Diagnostic Studies:  Results for orders placed or performed during the hospital encounter of 09/26/15  Surgical pcr screen  Result Value Ref Range   MRSA, PCR NEGATIVE NEGATIVE   Staphylococcus aureus NEGATIVE NEGATIVE  CBC with Differential  Result Value Ref Range   WBC 10.8 (H) 4.0 - 10.5 K/uL   RBC 4.38 3.87 - 5.11 MIL/uL   Hemoglobin 13.0 12.0 - 15.0 g/dL   HCT 39.3 36.0 - 46.0 %   MCV 89.7 78.0 - 100.0 fL   MCH 29.7 26.0 - 34.0 pg   MCHC 33.1 30.0 - 36.0 g/dL   RDW 13.4 11.5 - 15.5 %   Platelets 286 150 - 400 K/uL   Neutrophils Relative % 62 %   Neutro Abs 6.7 1.7 - 7.7 K/uL   Lymphocytes  Relative 29 %   Lymphs Abs 3.1 0.7 - 4.0 K/uL   Monocytes Relative 7 %   Monocytes Absolute 0.7 0.1 - 1.0 K/uL   Eosinophils Relative 2 %   Eosinophils Absolute 0.2 0.0 - 0.7 K/uL   Basophils Relative 0 %   Basophils Absolute 0.0 0.0 - 0.1 K/uL  Comprehensive metabolic panel  Result Value Ref Range   Sodium 138 135 - 145 mmol/L   Potassium 3.7 3.5 - 5.1 mmol/L   Chloride 104 101 - 111 mmol/L   CO2 21 (L) 22 - 32 mmol/L   Glucose, Bld 121 (H) 65 - 99 mg/dL   BUN 15 6 - 20 mg/dL   Creatinine, Ser 0.78 0.44 - 1.00 mg/dL   Calcium 9.5 8.9 - 10.3 mg/dL   Total Protein 6.9 6.5 - 8.1 g/dL   Albumin 3.7 3.5 - 5.0 g/dL   AST 29 15 - 41 U/L   ALT 18 14 - 54 U/L   Alkaline Phosphatase 71 38 - 126 U/L   Total Bilirubin 0.7 0.3 - 1.2 mg/dL   GFR calc non Af Amer >60 >60 mL/min   GFR calc Af Amer >60 >60 mL/min   Anion gap 13 5 - 15  Lactic acid, plasma  Result Value Ref Range   Lactic Acid, Venous 1.2 0.5 - 1.9 mmol/L  Protime-INR  Result Value Ref Range   Prothrombin Time 13.3 11.4 - 15.2 seconds   INR 1.01   Lactic acid, plasma  Result Value Ref Range   Lactic Acid, Venous 1.1 0.5 - 1.9 mmol/L  I-Stat CG4 Lactic Acid, ED  Result Value Ref Range   Lactic Acid, Venous 1.47 0.5 - 1.9 mmol/L    Dg Cervical Spine 2-3 Views  Result Date: 09/29/2015 CLINICAL DATA:  Status post anterior fusion at C6 and C7 EXAM: DG C-ARM 61-120 MIN; CERVICAL SPINE - 2-3 VIEW COMPARISON:  September 28, 2015 FLUOROSCOPY TIME:  0 minutes 13 seconds; 2 acquired images FINDINGS: Frontal and lateral views were obtained. There is screw and plate fixation anteriorly at C6 and C7 with support hardware and disc spacer at C6-7 intact. No fracture or spondylolisthesis. There is moderate disc space narrowing at C5-6. There are anterior osteophytes at C4 and C5. IMPRESSION: Status post anterior screw and plate fixation at C6 and C7 with alignment anatomic in this area. Moderate disc space narrowing C5-6. Anterior osteophytes  at C4 and C5. No acute fracture or spondylolisthesis. Electronically Signed   By: Lowella Grip III M.D.   On: 09/29/2015 17:36   Dg Cervical Spine 2 Or 3 Views  Result Date: 09/28/2015 CLINICAL DATA:  Cervical clearing.  Spinal cord injury. EXAM: CERVICAL SPINE - 2-3 VIEW COMPARISON:  MRI 09/26/2015 FINDINGS: The previously seen fracture at C7 not well visualized by plain film. There is slight anterolisthesis of C6 on C7, approximately 5 mm. Degenerative disc disease in the mid to lower cervical spine. IMPRESSION: Unable to visualize the previously seen C7 fracture. Grade 1 anterolisthesis of C6 on C7. Spondylosis. Electronically Signed   By: Rolm Baptise M.D.   On: 09/28/2015 08:15   Dg Cervical Spine With Flex & Extend  Result Date: 09/28/2015 CLINICAL DATA:  C7 cervical fracture. EXAM: CERVICAL SPINE COMPLETE WITH FLEXION AND EXTENSION VIEWS COMPARISON:  CT of the cervix spine 09/28/2015 FINDINGS: There is a known C7 vertebral body fracture. There is a 4.5 mm anterior listhesis of C6 on C7 in neutral position, which increases to a 9 mm with flexion and 7 mm with extension. Multilevel osteoarthritic changes of the cervical spine as seen. No prevertebral soft tissue thickening. IMPRESSION: Known C7 vertebral body fracture which shows instability on flexion and extension with increasing anterior listhesis of C6 on C7. These results will be called to the ordering clinician or representative by the Radiologist Assistant, and communication documented in the PACS or zVision Dashboard. Electronically Signed   By: Fidela Salisbury M.D.   On: 09/28/2015 16:38   Dg Pelvis 1-2 Views  Result Date: 09/26/2015 CLINICAL DATA:  Fall EXAM: PELVIS - 1-2 VIEW COMPARISON:  None. FINDINGS: No fracture or dislocation is seen. Mild joint space narrowing of the bilateral hips. Visualized bony pelvis appears intact. Degenerative changes of the lower lumbar spine. IMPRESSION: No fracture or dislocation is seen.  Electronically Signed   By: Julian Hy M.D.   On: 09/26/2015 18:30   Dg Elbow Complete Left  Result Date: 09/26/2015 CLINICAL DATA:  Fall EXAM: LEFT ELBOW - COMPLETE 3+ VIEW COMPARISON:  None. FINDINGS: No fracture or dislocation is seen. The joint spaces are preserved. Visualized soft tissues are within normal limits. No displaced elbow joint fat pads suggest an elbow joint effusion. No radiopaque foreign body is seen. IMPRESSION: No fracture, dislocation, or radiopaque foreign body is seen. Electronically Signed   By: Julian Hy M.D.   On: 09/26/2015 18:30   Dg Tibia/fibula Left  Result Date: 09/26/2015 CLINICAL DATA:  Fall EXAM: LEFT TIBIA AND FIBULA - 2 VIEW COMPARISON:  None. FINDINGS: No fracture or dislocation is seen. The joint spaces are preserved. Visualized soft tissues are within normal limits. IMPRESSION: No fracture or dislocation is seen. Electronically Signed   By: Julian Hy M.D.   On: 09/26/2015 18:31   Dg Tibia/fibula Right  Result Date: 09/26/2015 CLINICAL DATA:  Fall EXAM: RIGHT TIBIA AND FIBULA - 2 VIEW COMPARISON:  None. FINDINGS: No fracture or dislocation is seen. Mild degenerative changes of the knee. Visualized soft tissues are within normal limits. IMPRESSION: No fracture or dislocation is seen. Electronically Signed   By: Julian Hy M.D.   On: 09/26/2015 18:31   Ct Head Wo Contrast  Addendum Date: 09/26/2015   ADDENDUM REPORT: 09/26/2015 23:06 ADDENDUM: Additional review of CT cervical spine examination demonstrates a nondisplaced anterior superior endplate corner fracture of the C7 vertebral body. Focal linear lucency and angularity at the superior articulating facet of C7 on the left suggesting an additional nondisplaced fracture in the posterior elements. Findings are better demonstrated on MRI cervical spine obtained today. See additional report. Electronically Signed   By: Lucienne Capers M.D.   On: 09/26/2015 23:06   Result Date:  09/26/2015 CLINICAL DATA:  Pt fell down an escalator. EXAM: CT HEAD WITHOUT CONTRAST CT CERVICAL SPINE WITHOUT CONTRAST TECHNIQUE: Multidetector CT imaging of the head and cervical spine was performed following the standard protocol without intravenous contrast. Multiplanar CT image reconstructions of the cervical spine were also generated. COMPARISON:  None. FINDINGS: CT HEAD FINDINGS Diffuse cerebral atrophy. Ventricular dilatation is likely due to central atrophy. Patchy low-attenuation changes in the deep white matter consistent with small vessel ischemia. Minimal basal ganglia calcifications, likely dystrophic. No mass effect or midline shift. No abnormal extra-axial fluid collections. Gray-white matter junctions are distinct. Basal cisterns are not effaced. No evidence of acute intracranial hemorrhage. No depressed skull fractures. Visualized paranasal sinuses and mastoid air cells are not opacified. CT CERVICAL SPINE FINDINGS Straightening of the usual cervical lordosis. This may be due to patient positioning but ligamentous injury or muscle spasm could also have this appearance and are not excluded. No anterior subluxation. Normal alignment of the cervical facet joints. Degenerative changes with narrowed cervical interspaces and endplate hypertrophic changes, most prominent at C4-5, C5-6, and C6-7 levels. Prominent hypertrophic changes demonstrated at C5-6 and C6-7 posteriorly may cause some impression on the central canal. Uncovertebral spurring causes bone encroachment upon neural foramina bilaterally. This is most prominent at C5-6 on the right. No vertebral compression deformities. No prevertebral soft tissue swelling. C1-2 articulation appears intact. No focal bone lesion or bone destruction. Vascular calcifications in the cervical carotid arteries. IMPRESSION: No acute intracranial abnormalities. Chronic atrophy and small vessel ischemic changes. Nonspecific straightening of usual cervical lordosis.  Diffuse degenerative changes in the cervical spine. No acute displaced fractures identified. Electronically Signed: By: Lucienne Capers M.D. On: 09/26/2015 18:52   Ct Chest W Contrast  Result Date: 09/26/2015 CLINICAL DATA:  Level 2 trauma. Pt fell down escalator. EXAM: CT CHEST, ABDOMEN, AND PELVIS WITH CONTRAST TECHNIQUE: Multidetector CT imaging of the chest, abdomen and pelvis was performed following the standard protocol during bolus administration of intravenous contrast. CONTRAST:  137mL ISOVUE-300 IOPAMIDOL (ISOVUE-300) INJECTION 61% COMPARISON:  None. FINDINGS: CT CHEST FINDINGS Mediastinum/Lymph Nodes: No masses, pathologically enlarged lymph nodes, or other significant abnormality. Normal caliber thoracic aorta. No aneurysm or dissection. Scattered calcification of the aorta. Calcification in the coronary arteries. Lungs/Pleura: Diffuse emphysematous changes in the lungs. Scattered fibrosis in the periphery in basis. No focal  consolidation. Airways are patent. No pleural effusions. No pneumothorax. Musculoskeletal: No chest wall mass or suspicious bone lesions identified. Normal alignment of the thoracic spine. Degenerative changes throughout the spine. No compression deformities. Sternum and ribs appear intact. CT ABDOMEN PELVIS FINDINGS Hepatobiliary: Mild diffuse fatty infiltration of the liver. Gallbladder and bile ducts are unremarkable. Pancreas: No mass, inflammatory changes, or other significant abnormality. Spleen: Within normal limits in size and appearance. Adrenals/Urinary Tract: Minimal left adrenal gland nodule measuring 10 mm diameter. Based on size criteria, likely to be benign. No evidence of hydronephrosis. Small parapelvic cysts in the kidneys. Stomach/Bowel: Prior right hemicolectomy with ileocolonic anastomosis. Stomach is decompressed. Small bowel are not abnormally distended. Colon is not distended. No free air or free fluid in the abdomen. Vascular/Lymphatic: No pathologically  enlarged lymph nodes. No evidence of abdominal aortic aneurysm. Calcification of the abdominal aorta. Reproductive: Surgical absence of the uterus. No pelvic mass or lymphadenopathy. Other: Diverticulosis of the sigmoid colon without evidence of diverticulitis. Musculoskeletal: Degenerative changes in the lumbar spine. No vertebral compression deformities. Sacrum, pelvis, and hips appear intact. Tarlov cysts in the sacrum. IMPRESSION: No acute posttraumatic changes suggested in the chest, abdomen, or pelvis. No evidence of mediastinal injury or pulmonary parenchymal injury. No evidence of solid organ injury or bowel perforation. Chronic changes as noted above. Electronically Signed   By: Lucienne Capers M.D.   On: 09/26/2015 19:34   Ct Cervical Spine Wo Contrast  Result Date: 09/28/2015 CLINICAL DATA:  Fall backwards down escalator. Neck pain. C7 fracture. EXAM: CT CERVICAL SPINE WITHOUT CONTRAST TECHNIQUE: Multidetector CT imaging of the cervical spine was performed without intravenous contrast. Multiplanar CT image reconstructions were also generated. COMPARISON:  Plain films 09/28/2015. Cervical spine CT 09/26/2015. MRI 09/26/2014. FINDINGS: Again noted is the fracture through the instead anterior superior corner of C7 as seen on prior CT and MRI. Fractures also noted through the posterior elements on the left at C7 involving the left superior facet. This is unchanged since prior study. 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 No additional fracture noted. Alignment is normal. IMPRESSION: Again noted are fractures through the anterior superior endplate at C7 and the left C7 superior facet. Findings stable since prior studies. Electronically Signed   By: Rolm Baptise M.D.   On: 09/28/2015 14:10   Ct Cervical Spine Wo Contrast  Addendum Date: 09/26/2015   ADDENDUM REPORT: 09/26/2015 23:06 ADDENDUM: Additional review of CT cervical spine examination demonstrates a nondisplaced  anterior superior endplate corner fracture of the C7 vertebral body. Focal linear lucency and angularity at the superior articulating facet of C7 on the left suggesting an additional nondisplaced fracture in the posterior elements. Findings are better demonstrated on MRI cervical spine obtained today. See additional report. Electronically Signed   By: Lucienne Capers M.D.   On: 09/26/2015 23:06   Result Date: 09/26/2015 CLINICAL DATA:  Pt fell down an escalator. EXAM: CT HEAD WITHOUT CONTRAST CT CERVICAL SPINE WITHOUT CONTRAST TECHNIQUE: Multidetector CT imaging of the head and cervical spine was performed following the standard protocol without intravenous contrast. Multiplanar CT image reconstructions of the cervical spine were also generated. COMPARISON:  None. FINDINGS: CT HEAD FINDINGS Diffuse cerebral atrophy. Ventricular dilatation is likely due to central atrophy. Patchy low-attenuation changes in the deep white matter consistent with small vessel ischemia. Minimal basal ganglia calcifications, likely dystrophic. No mass effect or midline shift. No abnormal extra-axial fluid collections. Gray-white matter junctions are distinct. Basal cisterns are not effaced. No  evidence of acute intracranial hemorrhage. No depressed skull fractures. Visualized paranasal sinuses and mastoid air cells are not opacified. CT CERVICAL SPINE FINDINGS Straightening of the usual cervical lordosis. This may be due to patient positioning but ligamentous injury or muscle spasm could also have this appearance and are not excluded. No anterior subluxation. Normal alignment of the cervical facet joints. Degenerative changes with narrowed cervical interspaces and endplate hypertrophic changes, most prominent at C4-5, C5-6, and C6-7 levels. Prominent hypertrophic changes demonstrated at C5-6 and C6-7 posteriorly may cause some impression on the central canal. Uncovertebral spurring causes bone encroachment upon neural foramina  bilaterally. This is most prominent at C5-6 on the right. No vertebral compression deformities. No prevertebral soft tissue swelling. C1-2 articulation appears intact. No focal bone lesion or bone destruction. Vascular calcifications in the cervical carotid arteries. IMPRESSION: No acute intracranial abnormalities. Chronic atrophy and small vessel ischemic changes. Nonspecific straightening of usual cervical lordosis. Diffuse degenerative changes in the cervical spine. No acute displaced fractures identified. Electronically Signed: By: Lucienne Capers M.D. On: 09/26/2015 18:52   Mr Cervical Spine Wo Contrast  Result Date: 09/26/2015 CLINICAL DATA:  [place] [address] down escalator. Followup ligamentous injury or cord contusion. EXAM: MRI CERVICAL SPINE WITHOUT CONTRAST TECHNIQUE: Multiplanar, multisequence MR imaging of the cervical spine was performed. No intravenous contrast was administered. COMPARISON:  CT cervical spine September 26, 2015 at 1819 hours FINDINGS: Alignment: Straightened cervical lordosis. Minimal grade 1 C7-T1 anterolisthesis without spondylolysis. Vertebrae: Vertically oriented acute fracture through anterior superior endplate of C7 with focally disrupted anterior longitudinal ligament. T2 bright edema within the C6-7 disc. The remaining vertebral bodies are intact. Severe C6-7 disc height loss, moderate at C5-6 with decreased T2 signal within all disc compatible with desiccation. Moderate to severe chronic discogenic endplate changes [id number]-GRM and C6-7, moderate at C4-5. Cord: Subcentimeter focus of T2 bright signal within the spinal cord at C7, without volume loss. No syrinx. No susceptibility artifact to suggest hemorrhage. Craniocervical junction intact. Posterior Fossa, vertebral arteries, paraspinal tissues: Minimal effusion/hemorrhage within prevertebral space at C6-7. Interstitial bright STIR signal C6-7 interspinous space. Interstitial bright STIR signal within the head bilateral RIGHT greater than  LEFT paraspinal soft tissues extending into the thoracic paraspinal soft tissues. Interstitial bright STIR signal RIGHT scalene muscles. Disc levels: C2-3: No disc bulge. Mild facet arthropathy. No canal stenosis or neural foraminal narrowing. C3-4: Uncovertebral hypertrophy and mild facet arthropathy without canal stenosis. Mild RIGHT neural foraminal narrowing. C4-5: Small central disc protrusion, uncovertebral hypertrophy mild facet arthropathy without canal stenosis. Mild RIGHT neural foraminal narrowing. C5-6: Small broad-based disc bulge, uncovertebral hypertrophy and mild facet arthropathy. Moderate canal stenosis. Severe RIGHT greater than LEFT neural foraminal narrowing. C6-7: Small broad-based disc bulge, uncovertebral hypertrophy mild facet arthropathy. Moderate canal stenosis. Severe RIGHT, moderate LEFT neural foraminal narrowing. C7-T1: Anterolisthesis. Severe RIGHT and mild LEFT facet arthropathy without canal stenosis. Severe RIGHT neural foraminal narrowing. IMPRESSION: Acute anterior superior endplate nondisplaced C7 fracture. Focal anterior longitudinal ligament disruption C6-7. C6-7 interspinous ligament strain. Sub cm abnormal cord signal C7 concerning for cord contusion. No cord hemorrhage. Midgrade RIGHT greater than LEFT paraspinal and scalene muscle strain. Moderate canal stenosis C5-6 and C6-7. Neural foraminal narrowing C3-4 thru C7-T1: Severe at C5-6 through C7-T1. Electronically Signed   By: Elon Alas M.D.   On: 09/26/2015 22:41   Ct Abdomen Pelvis W Contrast  Result Date: 09/26/2015 CLINICAL DATA:  Level 2 trauma. Pt fell down escalator. EXAM: CT CHEST, ABDOMEN, AND PELVIS WITH CONTRAST TECHNIQUE: Multidetector CT  imaging of the chest, abdomen and pelvis was performed following the standard protocol during bolus administration of intravenous contrast. CONTRAST:  182mL ISOVUE-300 IOPAMIDOL (ISOVUE-300) INJECTION 61% COMPARISON:  None. FINDINGS: CT CHEST FINDINGS  Mediastinum/Lymph Nodes: No masses, pathologically enlarged lymph nodes, or other significant abnormality. Normal caliber thoracic aorta. No aneurysm or dissection. Scattered calcification of the aorta. Calcification in the coronary arteries. Lungs/Pleura: Diffuse emphysematous changes in the lungs. Scattered fibrosis in the periphery in basis. No focal consolidation. Airways are patent. No pleural effusions. No pneumothorax. Musculoskeletal: No chest wall mass or suspicious bone lesions identified. Normal alignment of the thoracic spine. Degenerative changes throughout the spine. No compression deformities. Sternum and ribs appear intact. CT ABDOMEN PELVIS FINDINGS Hepatobiliary: Mild diffuse fatty infiltration of the liver. Gallbladder and bile ducts are unremarkable. Pancreas: No mass, inflammatory changes, or other significant abnormality. Spleen: Within normal limits in size and appearance. Adrenals/Urinary Tract: Minimal left adrenal gland nodule measuring 10 mm diameter. Based on size criteria, likely to be benign. No evidence of hydronephrosis. Small parapelvic cysts in the kidneys. Stomach/Bowel: Prior right hemicolectomy with ileocolonic anastomosis. Stomach is decompressed. Small bowel are not abnormally distended. Colon is not distended. No free air or free fluid in the abdomen. Vascular/Lymphatic: No pathologically enlarged lymph nodes. No evidence of abdominal aortic aneurysm. Calcification of the abdominal aorta. Reproductive: Surgical absence of the uterus. No pelvic mass or lymphadenopathy. Other: Diverticulosis of the sigmoid colon without evidence of diverticulitis. Musculoskeletal: Degenerative changes in the lumbar spine. No vertebral compression deformities. Sacrum, pelvis, and hips appear intact. Tarlov cysts in the sacrum. IMPRESSION: No acute posttraumatic changes suggested in the chest, abdomen, or pelvis. No evidence of mediastinal injury or pulmonary parenchymal injury. No evidence of  solid organ injury or bowel perforation. Chronic changes as noted above. Electronically Signed   By: Lucienne Capers M.D.   On: 09/26/2015 19:34   Dg C-arm 1-60 Min  Result Date: 09/29/2015 CLINICAL DATA:  Status post anterior fusion at C6 and C7 EXAM: DG C-ARM 61-120 MIN; CERVICAL SPINE - 2-3 VIEW COMPARISON:  September 28, 2015 FLUOROSCOPY TIME:  0 minutes 13 seconds; 2 acquired images FINDINGS: Frontal and lateral views were obtained. There is screw and plate fixation anteriorly at C6 and C7 with support hardware and disc spacer at C6-7 intact. No fracture or spondylolisthesis. There is moderate disc space narrowing at C5-6. There are anterior osteophytes at C4 and C5. IMPRESSION: Status post anterior screw and plate fixation at C6 and C7 with alignment anatomic in this area. Moderate disc space narrowing C5-6. Anterior osteophytes at C4 and C5. No acute fracture or spondylolisthesis. Electronically Signed   By: Lowella Grip III M.D.   On: 09/29/2015 17:36    Antibiotics:  Anti-infectives    Start     Dose/Rate Route Frequency Ordered Stop   09/29/15 2300  ceFAZolin (ANCEF) IVPB 1 g/50 mL premix     1 g 100 mL/hr over 30 Minutes Intravenous Every 8 hours 09/29/15 1837 09/30/15 0654   09/29/15 1702  bacitracin 50,000 Units in sodium chloride irrigation 0.9 % 500 mL irrigation  Status:  Discontinued       As needed 09/29/15 1703 09/29/15 1741   09/29/15 1555  ceFAZolin (ANCEF) 2-4 GM/100ML-% IVPB    Comments:  Sampson Si   : cabinet override      09/29/15 1555 09/30/15 0359      Discharge Exam: Blood pressure (!) 132/58, pulse 69, temperature 98.3 F (36.8 C), temperature source Oral, resp.  rate 20, height 5\' 9"  (1.753 m), weight 79.4 kg (175 lb 0.7 oz), SpO2 98 %. Neurologic: Alert and oriented X 3, normal strength and tone. Normal symmetric reflexes. Normal coordination and gait, except for hands and triceps remained 4 minus out of 5 Dressing dry, collar in place  Discharge  Medications:     Medication List    TAKE these medications   calcium carbonate 1500 (600 Ca) MG Tabs tablet Commonly known as:  OSCAL Take 1,500 mg by mouth 2 (two) times daily with a meal.   lisinopril-hydrochlorothiazide 20-25 MG tablet Commonly known as:  PRINZIDE,ZESTORETIC Take 1 tablet by mouth daily.   multivitamin with minerals Tabs tablet Take 1 tablet by mouth every evening.   Vitamin D 2000 units tablet Take 2,000 Units by mouth daily.       Disposition: CIR   Final Dx: central cord injury, cervical instability, ACDF C6-7  Discharge Instructions    Call MD for:  difficulty breathing, headache or visual disturbances    Complete by:  As directed   Call MD for:  persistant nausea and vomiting    Complete by:  As directed   Call MD for:  redness, tenderness, or signs of infection (pain, swelling, redness, odor or green/yellow discharge around incision site)    Complete by:  As directed   Call MD for:  severe uncontrolled pain    Complete by:  As directed   Call MD for:  temperature >100.4    Complete by:  As directed   Diet - low sodium heart healthy    Complete by:  As directed   Discharge instructions    Complete by:  As directed   Must wear collar at all times   Increase activity slowly    Complete by:  As directed   Remove dressing in 24 hours    Complete by:  As directed         Signed: Ashlyn Cabler S 10/01/2015, 9:44 AM

## 2015-10-01 NOTE — Progress Notes (Signed)
I have insurance approval to admit pt to inpt rehab today. I contacted Dr. Ronnald Ramp and he is in agreement for pt to dc to CIR today. I will notify pt,  family, RN CM and SW. I will make the arrangements to admit today. [id number]

## 2015-10-01 NOTE — PMR Pre-admission (Signed)
PMR Admission Coordinator Pre-Admission Assessment  Patient: Terri Bradley is an 82 y.o., female MRN: [id number] DOB: 1927/03/30 Height: 5\' 9"  (175.3 cm) Weight: 79.4 kg (175 lb 0.7 oz)              Insurance Information  Third Party Liability pending Legal followup  HMO:     PPO: yes     PCP:      IPA:      80/20:      OTHER: Medicare advantage plan PRIMARY: [employer] Medicare      Policy#: [id number]      Subscriber: pt CM Name: Orvan July      Phone#: [phone]     Fax#: [phone] Pre-Cert#: [id number]      Employer: approved for 7 days //u CM is Albina Billet at phone [phone] fax: EPIC access Benefits:  Phone #: [phone]     Name: 09/30/15 Eff. Date: 02/01/2013     Deduct: none      Out of Pocket Max: $4000      Life Max: none CIR: $160 copay per day days 1-10 then covered 100%      SNF: no copay days 1-20; $50 copay days 21-100 Outpatient: $20 copay per visit     Co-Pay: no visit limit Home Health: 100%      Co-Pay: no visit limit DME: 80%     Co-Pay: 20% Providers: in network  SECONDARY: none      Medicaid Application Date:       Case Manager:  Disability Application Date:       Case Worker:   Emergency Contact Information Contact Information    Name Relation Home Work Mobile   Allred,Terri Bradley Daughter [phone]  [phone]     Current Medical History  Patient Admitting Diagnosis: central cord syndrome  History of Present Illness:  Terri Gramley Terri Bradley a 82 y.o femalewith history of hypertension otherwise in relatively good health. She was admitted on  09/26/2015 after a fall on an escalator while attending a convention meeting. She backwards down several steps and was unable to move BUE/BLE initially. On evaluation in ED she was able to move BLE but complained of burning and weakness BUE as well as pain at base of neck. MRI cervical spine revealed anterior superior endplate nondisplaced fracture C7, focal longitudinal ligament disruption at C6-C7  abnormal cord signal C7 concerning for cord contusion, moderate canal stenosis C5-C7 and R>L paraspinal and scalene muscle strain. Collar for stabilization and conservative care recommended by Dr. Annette Stable for management of central cord syndrome.  Patient continued to have weakness/numbness BUE and follow up flex-extensive views showed significant ligamentous instability at C6-C7.    Surgical intervention was recommended and she underwent ACDF C6-C7 on 08/28 by Dr. Ronnald Ramp. Post op has had problems voiding requiring in and out caths with volumes  At 600-900 cc.       Past Medical History   Past Medical History  Past Medical History:  Diagnosis Date  . Colon cancer (HCC)--stage 1   . Diverticulosis   . HTN (hypertension)   . Hypertension     Family History  family history is not on file.  Prior Rehab/Hospitalizations:  Has the patient had major surgery during 100 days prior to admission? No  Current Medications   Current Facility-Administered Medications:  .  0.9 %  sodium chloride infusion, 250 mL, Intravenous, Continuous, Eustace Moore, MD .  0.9 % NaCl with KCl 20 mEq/ L  infusion, , Intravenous, Continuous,  Eustace Moore, MD, Last Rate: 75 mL/hr at 09/30/15 0421 .  acetaminophen (TYLENOL) tablet 650 mg, 650 mg, Oral, Q4H PRN **OR** acetaminophen (TYLENOL) suppository 650 mg, 650 mg, Rectal, Q4H PRN, Eustace Moore, MD .  lisinopril (PRINIVIL,ZESTRIL) tablet 20 mg, 20 mg, Oral, Daily, 20 mg at 10/01/15 1004 **AND** hydrochlorothiazide (HYDRODIURIL) tablet 25 mg, 25 mg, Oral, Daily, Eustace Moore, MD, 25 mg at 10/01/15 1004 .  HYDROcodone-acetaminophen (NORCO/VICODIN) 5-325 MG per tablet 1-2 tablet, 1-2 tablet, Oral, Q4H PRN, Eustace Moore, MD, 2 tablet at 10/01/15 0429 .  menthol-cetylpyridinium (CEPACOL) lozenge 3 mg, 1 lozenge, Oral, PRN **OR** phenol (CHLORASEPTIC) mouth spray 1 spray, 1 spray, Mouth/Throat, PRN, Eustace Moore, MD .  morphine 2 MG/ML injection 1-4 mg, 1-4 mg,  Intravenous, Q3H PRN, Eustace Moore, MD, 2 mg at 09/29/15 2026 .  multivitamin with minerals tablet 1 tablet, 1 tablet, Oral, QPM, Eustace Moore, MD, 1 tablet at 09/30/15 1720 .  ondansetron (ZOFRAN) injection 4 mg, 4 mg, Intravenous, Q4H PRN, Eustace Moore, MD .  sodium chloride flush (NS) 0.9 % injection 3 mL, 3 mL, Intravenous, Q12H, Eustace Moore, MD, 3 mL at 10/01/15 1005 .  sodium chloride flush (NS) 0.9 % injection 3 mL, 3 mL, Intravenous, PRN, Eustace Moore, MD, 3 mL at 09/29/15 2034  Patients Current Diet: Diet regular Room service appropriate? Yes; Fluid consistency: Thin Diet - low sodium heart healthy  Precautions / Restrictions Precautions Precautions: Fall, Cervical Cervical Brace: Hard collar, At all times Restrictions Weight Bearing Restrictions: No   Has the patient had 2 or more falls or a fall with injury in the past year?No  Prior Activity Level Community (5-7x/wk): independent and active, driving, mowing grass, completely independent  Development worker, international aid / [place] Devices/Equipment: None Home Equipment: Walker - 2 wheels, Bedside commode, Shower seat  Prior Device Use: Indicate devices/aids used by the patient prior to current illness, exacerbation or injury? None of the above  Prior Functional Level Prior Function Level of Independence: Independent  Self Care: Did the patient need help bathing, dressing, using the toilet or eating?  Independent  Indoor Mobility: Did the patient need assistance with walking from room to room (with or without device)? Independent  Stairs: Did the patient need assistance with internal or external stairs (with or without device)? Independent  Functional Cognition: Did the patient need help planning regular tasks such as shopping or remembering to take medications? Independent  Current Functional Level Cognition  Overall Cognitive Status: Within Functional Limits for tasks assessed Orientation Level:  Oriented X4 Safety/Judgement: Decreased awareness of deficits General Comments: Pt appears better cognitively today compared to 8/26.  Pt very motivated but limited in her UEs due to the nature of central cord injury.  Pt requires a great amount of assist for basic adls at this point but is motivated to work hard and increase her independence. Pt provided with built up handles to assist in feeding herself.  Pt can use a fork and spoon for short periods of time without the handles but does fatigue quickly so equipment provided to use at the end of meals to increase independence.      Extremity Assessment (includes Sensation/Coordination)  Upper Extremity Assessment: RUE deficits/detail, LUE deficits/detail RUE Deficits / Details: Able to perfrom full AROM without pain. Shoulder flex/ext 4/5, elbow flex/ext 4/5, wrist 3+/5, decreased grip strength. Pt reports pins and needles feeling in fingers. Decreased gross motor coordination. RUE Coordination:  decreased gross motor, decreased fine motor LUE Deficits / Details: Intermittent pain during functional activities. Able to perfrom full AROM without pain. Shoulder flex/ext 4/5, elbow flex/ext 4/5, wrist 3+/5, decreased grip strength. Pt reports pins and needles feeling in fingers. Decreased gross motor coordination. Did not notice shoulder sublux with palpation. LUE Coordination: decreased gross motor, decreased fine motor  Lower Extremity Assessment: Defer to PT evaluation    ADLs  Overall ADL's : Needs assistance/impaired Eating/Feeding: Minimal assistance, Sitting Eating/Feeding Details (indicate cue type and reason): Pt only requires min assist at the end of meal when she becomes tired.  Built up handles will assist with this.  Pt unable to cut food at this time so set up required.  A rocker knife may be helpful if pt does not increase strength. Grooming: Minimal assistance, Standing Grooming Details (indicate cue type and reason): Pt stood at sink  for 4 minutes with min assist for balance and min assist to brush teeth.  Pt dropped toothbrush and paste several times but was able to finish task with min assist. Upper Body Bathing: Sitting, Minimal assitance Upper Body Bathing Details (indicate cue type and reason): Pt unable to squeeze out the washcloth fully with strength at 3+/5 in grip. Pt drops washcloth frequently.  Feel she can do without adaptive washcloth at this time to challenge her to hold to normal cloth. Lower Body Bathing: Maximal assistance, Sit to/from stand Lower Body Bathing Details (indicate cue type and reason): Pt was able to cross lower legs this am to bathe past her knee but still unable to reach feet without assist.   Upper Body Dressing : Moderate assistance, Sitting Upper Body Dressing Details (indicate cue type and reason): Pt requires total assist at this time to donn bra; unable to clasp or put over head due to weakness.  Pt can donn loose fitting shirt but unable to manage fasteners.  May need button hook; will see how pt progresses. Lower Body Dressing: Maximal assistance, Sit to/from stand Lower Body Dressing Details (indicate cue type and reason): Pt doffed socks with min assist today to hold leg crossed over so she could reach socks. Pt requires total assist to tie shoes and manage fasteners.  Toilet Transfer: Moderate assistance, Comfort height toilet, Grab bars, RW Toilet Transfer Details (indicate cue type and reason): Pt walked to bathroom with walker. Pt with incoordination of BLEs when ambulating. See PT notes. Toileting- Clothing Manipulation and Hygiene: Maximal assistance, Sitting/lateral lean Toileting - Clothing Manipulation Details (indicate cue type and reason): Pt can clean self after urinating but does not have strength or coordiation to do so after bowel movement. Functional mobility during ADLs: Moderate assistance, Rolling walker General ADL Comments: Pt struggles with adls but very motivated to  be independent.  Pt realized she could do more than she thought at beginning of session when she tried new things.  She was able to do most adls with mod assist and grooming/feeding with min assist.     Mobility  Overal bed mobility: Needs Assistance Bed Mobility: Rolling, Sidelying to Sit Rolling: Min assist Sidelying to sit: Mod assist General bed mobility comments: Assist for elevation of trunk to sitting. Pt with L shoulder pain during bed mobility.    Transfers  Overall transfer level: Needs assistance Equipment used: 2 person hand held assist Transfers: Sit to/from Stand Sit to Stand: Mod assist Stand pivot transfers: Mod assist General transfer comment: Pt with increased independence today with transfers.  Two people there for safety  but only needed 1 person assist for most of session.    Ambulation / Gait / Stairs / Wheelchair Mobility  Ambulation/Gait Ambulation/Gait assistance: Museum/gallery curator (Feet): 80 Feet Assistive device: Rolling walker (2 wheeled) Gait Pattern/deviations: Step-through pattern General Gait Details: mildly unsteady overall. Uncoordinated advance of L LE and appears less confident weightbearing in the R LE Gait velocity: slower Gait velocity interpretation: at or above normal speed for age/gender    Posture / Balance Balance Overall balance assessment: Needs assistance Sitting-balance support: Bilateral upper extremity supported, Feet supported Sitting balance-Leahy Scale: Fair Standing balance support: Bilateral upper extremity supported, During functional activity Standing balance-Leahy Scale: Poor Standing balance comment: reliant on the RW    Special needs/care consideration Skin pt with abrasions and skin tears to left arm, foot and leg. Also surgical incision Bowel mgmt: continent LBM 8/26 Bladder mgmt: bladder retention requiring in and out catherizations Diabetic mgmt n/a   Previous Home Environment Living Arrangements:  Alone  Lives With: Alone Available Help at Discharge: Friend(s), Available PRN/intermittently Type of Home: House Home Layout: One level Home Access: Stairs to enter Technical brewer of Steps: 1 Bathroom Shower/Tub: Public librarian, Multimedia programmer: Standard Bathroom Accessibility: Yes How Accessible: Accessible via walker Aldrich: No  Discharge Living Setting Plans for Discharge Living Setting: Patient's home, Alone Type of Home at Discharge: House Discharge Home Layout: One level Discharge Home Access: Stairs to enter Entrance Stairs-Rails: None Entrance Stairs-Number of Steps: 1 Discharge Bathroom Shower/Tub: Tub/shower unit, Curtain Discharge Bathroom Toilet: Standard Discharge Bathroom Accessibility: Yes How Accessible: Accessible via walker Does the patient have any problems obtaining your medications?: No  Social/Family/Support Systems Patient Roles: Parent (one daughter, Terri Bradley) Contact Information: Terri Bradley, daughter Anticipated Caregiver: duaghter and family at night. They have not arranged supervision during the day Anticipated Caregiver's Contact Information: see above Ability/Limitations of Caregiver: daughter works. Grandson works nights Caregiver Availability: Intermittent (family has not arranged 24/7 supervision) Discharge Plan Discussed with Primary Caregiver: Yes Is Caregiver In Agreement with Plan?: Yes Does Caregiver/Family have Issues with Lodging/Transportation while Pt is in Rehab?: No  [name] daughter has contacted a lawyer who is assisting with all three cases of women who were injured on the escalator at the same time. O have discussed with pt and daughter that 24/7 supervision at d/c is recommended. Family can provide supervision after work at night, but do not have a plan for supervision when they work. They say hired help, but I perceive they feel lawyer will help arrange that due to the liability.  Goals/Additional  Needs Patient/Family Goal for Rehab: supervision with PT, OT Expected length of stay: ELOS 14-17 days Special Service Needs: Pt has hired a Chief Executive Officer for three women injured in the accident Pt/Family Agrees to Admission and willing to participate: Yes Program Orientation Provided & Reviewed with Pt/Caregiver Including Roles  & Responsibilities: Yes  Decrease burden of Care through IP rehab admission: n/a  Possible need for SNF placement upon discharge:if 24/7 supervision can not be arranged it may be necessary. Daughter discussing with lawyer and perceives hired help to provide supervision.  Patient Condition: This patient's condition remains as documented in the consult dated 09/29/2015, in which the Rehabilitation Physician determined and documented that the patient's condition is appropriate for intensive rehabilitative care in an inpatient rehabilitation facility. Will admit to inpatient rehab today.   Preadmission Screen Completed By:  Cleatrice Burke, 10/01/2015 10:45 AM ______________________________________________________________________   Discussed status with Dr. Posey Pronto on 10/01/2015 at  1045 and received telephone approval for admission today.  Admission Coordinator:  Cleatrice Burke, time [id number] Date 10/01/2015.

## 2015-10-01 NOTE — Progress Notes (Signed)
Physical Therapy Treatment Patient Details Name: Terri Bradley MRN: [id number] DOB: 04/05/1927 Today's Date: 10/01/2015    History of Present Illness Admitted after fall on escalator at a convention, resulting in [place] spinal cord contusion; motor return to UEs and LEs, still tingling bil UEs; opting for non-operative management; Collar at all times; Pt did end up undergoing a ACDF on 8/28.  Collar at all times.     PT Comments    Progressing steadily, still needing stability assist and some help coordinating maneuvering on the RW.  Follow Up Recommendations  CIR     Equipment Recommendations  Rolling walker with 5" wheels;3in1 (PT)    Recommendations for Other Services Rehab consult     Precautions / Restrictions Precautions Precautions: Fall;Cervical Required Braces or Orthoses: Cervical Brace Cervical Brace: Hard collar;At all times    Mobility  Bed Mobility               General bed mobility comments: up in the chair on arrival  Transfers Overall transfer level: Needs assistance   Transfers: Sit to/from Stand Sit to Stand: Min assist         General transfer comment: steady assist and cues for hand placement  Ambulation/Gait Ambulation/Gait assistance: Min assist Ambulation Distance (Feet): 100 Feet (x2 with standing rest in between to regroup) Assistive device: Rolling walker (2 wheeled) Gait Pattern/deviations: Step-through pattern Gait velocity: slower Gait velocity interpretation: Below normal speed for age/gender General Gait Details: still with mild unsteadiness.  Not as uncoordinated L LE.  Pt able to move RW   Stairs            Wheelchair Mobility    Modified Rankin (Stroke Patients Only)       Balance Overall balance assessment: Needs assistance   Sitting balance-Leahy Scale: Good       Standing balance-Leahy Scale: Poor Standing balance comment: still reliant on the RW                    Cognition  Arousal/Alertness: Awake/alert Behavior During Therapy: [facility] for tasks assessed/performed;Flat affect Overall Cognitive Status: Within Functional Limits for tasks assessed                      Exercises      General Comments General comments (skin integrity, edema, etc.): Ended in the bathroom.  Needed some assist keeping gown out of the way to get pants down.  Cues for transfer safety      Pertinent Vitals/Pain Pain Assessment: Faces Faces Pain Scale: Hurts a little bit Pain Location: neck Pain Descriptors / Indicators: Discomfort Pain Intervention(s): Monitored during session    Home Living                      Prior Function            PT Goals (current goals can now be found in the care plan section) Acute Rehab PT Goals Patient Stated Goal: be more independent PT Goal Formulation: With patient Time For Goal Achievement: 10/11/15 Potential to Achieve Goals: Good Progress towards PT goals: Progressing toward goals    Frequency  Min 4X/week    PT Plan Current plan remains appropriate    Co-evaluation             End of Session   Activity Tolerance: Patient tolerated treatment well Patient left: Other (comment) (left on toilet and to use call bell when finished.)  Time: [id number] PT Time Calculation (min) (ACUTE ONLY): 21 min  Charges:  $Gait Training: 8-22 mins                    G Codes:      Anyra Kaufman, Tessie Fass 10/01/2015, 4:58 PM 10/01/2015  Donnella Sham, PT [phone]  (pager)

## 2015-10-01 NOTE — Care Management Note (Signed)
Case Management Note  Patient Details  Name: JANETZY BARUT MRN: [id number] Date of Birth: 1927/03/23  Subjective/Objective:                    Action/Plan: Pt discharging to CIR today. No further needs per CM.   Expected Discharge Date:                  Expected Discharge Plan:  IP Rehab Facility  In-House Referral:  Clinical Social Work  Discharge planning Services  CM Consult  Post Acute Care Choice:    Choice offered to:     DME Arranged:    DME Agency:     HH Arranged:    [place] Agency:     Status of Service:  Completed, signed off  If discussed at [employer], dates discussed:    Additional Comments:  Pollie Friar, RN 10/01/2015, 10:24 AM
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
## 2015-10-01 NOTE — Anesthesia Postprocedure Evaluation (Addendum)
Anesthesia Post Note  Patient: Terri Bradley  Procedure(s) Performed: Procedure(s) (LRB): ANTERIOR CERVICAL DECOMPRESSION/DISCECTOMY CERVICAL SIX-SEVEN (N/A)  Patient location during evaluation: PACU Anesthesia Type: General Level of consciousness: awake Pain management: pain level controlled Vital Signs Assessment: post-procedure vital signs reviewed and stable Respiratory status: spontaneous breathing Cardiovascular status: stable Postop Assessment: no signs of nausea or vomiting Anesthetic complications: no    Last Vitals:  Vitals:   10/01/15 0515 10/01/15 0953  BP: (!) 132/58 (!) 133/58  Pulse: 69 71  Resp: 20 16  Temp: 36.8 C 36.7 C    Last Pain:  Vitals:   10/01/15 0953  TempSrc: Oral  PainSc:                  Dino Borntreger

## 2015-10-01 NOTE — Progress Notes (Signed)
Ankit Lorie Phenix, MD Physician Signed Physical Medicine and Rehabilitation  Consult Note Date of Service: 09/29/2015 5:56 AM  Related encounter: ED to Hosp-Admission (Discharged) from 09/26/2015 in [place] All Collapse All   [] Hide copied text [] Hover for attribution information      Physical Medicine and Rehabilitation Consult Reason for Consult: Central cord syndrome Referring Physician: Dr. Sherley Bounds   HPI: Terri Bradley is a 82 y.o. right handed female with history of hypertension. Per chart review patient lives alone independent prior to admission. Presented 09/26/2015 after a fall on an escalator while attending a convention meeting. Reported she fell backwards down several steps to the escalator. Complaints of burning sensation in all extremities. Denied loss of consciousness. Cranial CT scan negative. CT/MRI cervical spine shows cervical spondylosis at C5-6 and 6-7 but no obvious fracture. There was a small area of signal change within the cord at C6-7 with no ongoing cord compression. Lateral plain film showed subluxation of C6-7 measuring about 5 mm that were not there on her CT or MRI upon admission. Await plan for possible surgical intervention.. Maintain in  [place] cervical collar. Hospital course pain management. Physical occupational therapy evaluations completed with recommendations of physical medicine rehabilitation consult.   Review of Systems  Constitutional: Negative for chills and fever.       Weakness to upper extremities  HENT: Negative for hearing loss.   Eyes: Negative for blurred vision and double vision.  Respiratory: Negative for cough and shortness of breath.   Cardiovascular: Negative for chest pain, palpitations and leg swelling.  Gastrointestinal: Positive for constipation. Negative for nausea and vomiting.  Genitourinary: Negative for dysuria and hematuria.  Musculoskeletal: Positive for  falls and myalgias.  Skin: Negative for rash.  Neurological: Positive for sensory change, focal weakness and headaches. Negative for seizures.  All other systems reviewed and are negative.      Past Medical History:  Diagnosis Date  . Hypertension         Past Surgical History:  Procedure Laterality Date  . CATARACT EXTRACTION    . colon tumor removal  2002   tumor removed "somewhere in colon, no treatment was ever needed."   No pertinent family history. Social History:  reports that she has never smoked. She has never used smokeless tobacco. She reports that she does not drink alcohol. Her drug history is not on file. Allergies: No Known Allergies       Medications Prior to Admission  Medication Sig Dispense Refill  . calcium carbonate (OSCAL) 1500 (600 Ca) MG TABS tablet Take 1,500 mg by mouth 2 (two) times daily with a meal.    . Cholecalciferol (VITAMIN D) 2000 units tablet Take 2,000 Units by mouth daily.    Marland Kitchen lisinopril-hydrochlorothiazide (PRINZIDE,ZESTORETIC) 20-25 MG tablet Take 1 tablet by mouth daily.    . Multiple Vitamin (MULTIVITAMIN WITH MINERALS) TABS tablet Take 1 tablet by mouth every evening.      Home: Home Living Family/patient expects to be discharged to:: Private residence Living Arrangements: Alone Available Help at Discharge: Friend(s), Available PRN/intermittently (may be able to arrange 24/7) Type of Home: House Home Access: Stairs to enter [employer] of Steps: 1 Home Layout: One level Bathroom Shower/Tub: Tub/shower unit, Walk-in shower (pt typically takes baths) Bathroom Toilet: Standard Home Equipment: Environmental consultant - 2 wheels, Bedside commode, Shower seat  Functional History: Prior Function Level of Independence: Independent Functional Status:  Mobility: Bed Mobility  Overal bed mobility: Needs Assistance Bed Mobility: Rolling, Sidelying to Sit Rolling: Min assist Sidelying to sit: Mod assist General bed mobility  comments: Assist for elevation of trunk to sitting. Pt with L shoulder pain during bed mobility. Transfers Overall transfer level: Needs assistance Equipment used: 2 person hand held assist Transfers: Sit to/from Stand, Stand Pivot Transfers Sit to Stand: Mod assist, +2 safety/equipment Stand pivot transfers: Mod assist, +2 physical assistance General transfer comment: Mod assist to boost up from EOB and for stand pivot to chair. Pt c/o pain throughout despite supporting bil UEs. Ambulation/Gait General Gait Details: Deferred to allow for RN to clarify activity orders    ADL: ADL Overall ADL's : Needs assistance/impaired Eating/Feeding: Set up, Sitting Grooming: Minimal assistance, Sitting Upper Body Bathing: Minimal assitance, Sitting Lower Body Bathing: Maximal assistance, Sit to/from stand Upper Body Dressing : Minimal assistance, Sitting Upper Body Dressing Details (indicate cue type and reason): to don hospital gown Lower Body Dressing: Maximal assistance, Sit to/from stand Lower Body Dressing Details (indicate cue type and reason): to adjust socks Toilet Transfer: Moderate assistance, +2 for physical assistance, Stand-pivot, BSC Toilet Transfer Details (indicate cue type and reason): Simulated by transfer from EOB to chair Functional mobility during ADLs: Moderate assistance, +2 for physical assistance (for stand pivot only) General ADL Comments: Pt reports severe L shoulder pain then radiating across shoulders intermittently with movement. Able to perfrom full AROM against gravity without pain. Discussed need for further rehab; pt is agreeable. Educated on cervical precautions and log roll technique for bed mobility.  Cognition: Cognition Overall Cognitive Status: Impaired/Different from baseline Orientation Level: Oriented X4 Cognition Arousal/Alertness: Awake/alert Behavior During Therapy: WFL for tasks assessed/performed, Flat affect Overall Cognitive Status:  Impaired/Different from baseline Area of Impairment: Memory, Safety/judgement, Problem solving Memory: Decreased short-term memory Safety/Judgement: Decreased awareness of deficits Problem Solving: Slow processing, Requires verbal cues, Requires tactile cues General Comments: Pt kept stating "why am I just so sore all over" despite repeated explanantions of pts fall.  Blood pressure (!) 121/57, pulse 64, temperature 97.7 F (36.5 C), temperature source Oral, resp. rate 18, height 5\' 9"  (1.753 m), weight 79.4 kg (175 lb 0.7 oz), SpO2 94 %. Physical Exam  Vitals reviewed. Constitutional: She is oriented to person, place, and time. She appears well-developed and well-nourished.  HENT:  Head: Normocephalic.  Right Ear: External ear normal.  Left Ear: External ear normal.  Eyes: Conjunctivae and EOM are normal.  Neck:  Cervical collar in place  Cardiovascular: Normal rate and regular rhythm.   Respiratory: Effort normal and breath sounds normal. No respiratory distress.  GI: Soft. Bowel sounds are normal. She exhibits no distension.  Musculoskeletal:  No edema or tenderness in extremities  Neurological: She is alert and oriented to person, place, and time.  Motor: B/l UE Shoulder abduction, elbow flexion 4/5, elbow extension, wrist extension, hand grip 3+/5 LLE: Hip flexion 3/5, knee extension 3+/5, ankle dorsi/plantar flexion 4-/5 RLE: Hip flexion 3+/5, knee extension 3+/5, ankle dorsi/plantar flexion 4/5 Sensation diminished to light touch b/l hands DTRs brisk b/l UE  Skin: Skin is warm and dry.  Psychiatric: She has a normal mood and affect. Her behavior is normal.    Lab Results Last 24 Hours  No results found for this or any previous visit (from the past 24 hour(s)).    Imaging Results (Last 48 hours)  Dg Cervical Spine 2 Or 3 Views  Result Date: 09/28/2015 CLINICAL DATA:  Cervical clearing.  Spinal cord injury. EXAM: CERVICAL  SPINE - 2-3 VIEW COMPARISON:  MRI 09/26/2015  FINDINGS: The previously seen fracture at C7 not well visualized by plain film. There is slight anterolisthesis of C6 on C7, approximately 5 mm. Degenerative disc disease in the mid to lower cervical spine. IMPRESSION: Unable to visualize the previously seen C7 fracture. Grade 1 anterolisthesis of C6 on C7. Spondylosis. Electronically Signed   By: Rolm Baptise M.D.   On: 09/28/2015 08:15   Dg Cervical Spine With Flex & Extend  Result Date: 09/28/2015 CLINICAL DATA:  C7 cervical fracture. EXAM: CERVICAL SPINE COMPLETE WITH FLEXION AND EXTENSION VIEWS COMPARISON:  CT of the cervix spine 09/28/2015 FINDINGS: There is a known C7 vertebral body fracture. There is a 4.5 mm anterior listhesis of C6 on C7 in neutral position, which increases to a 9 mm with flexion and 7 mm with extension. Multilevel osteoarthritic changes of the cervical spine as seen. No prevertebral soft tissue thickening. IMPRESSION: Known C7 vertebral body fracture which shows instability on flexion and extension with increasing anterior listhesis of C6 on C7. These results will be called to the ordering clinician or representative by the Radiologist Assistant, and communication documented in the PACS or zVision Dashboard. Electronically Signed   By: Fidela Salisbury M.D.   On: 09/28/2015 16:38   Ct Cervical Spine Wo Contrast  Result Date: 09/28/2015 CLINICAL DATA:  Fall backwards down escalator. Neck pain. C7 fracture. EXAM: CT CERVICAL SPINE WITHOUT CONTRAST TECHNIQUE: Multidetector CT imaging of the cervical spine was performed without intravenous contrast. Multiplanar CT image reconstructions were also generated. COMPARISON:  Plain films 09/28/2015. Cervical spine CT 09/26/2015. MRI 09/26/2014. FINDINGS: Again noted is the fracture through the instead anterior superior corner of C7 as seen on prior CT and MRI. Fractures also noted through the posterior elements on the left at C7 involving the left superior facet. This is unchanged  since prior study. 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 No additional fracture noted. Alignment is normal. IMPRESSION: Again noted are fractures through the anterior superior endplate at C7 and the left C7 superior facet. Findings stable since prior studies. Electronically Signed   By: Rolm Baptise M.D.   On: 09/28/2015 14:10     Assessment/Plan: Diagnosis: Central cord syndrome Labs and images independently reviewed.  Records reviewed and summated above.     Skin: daily skin checks     Cardiovascular: anticipate orthostasis when OOB. May use     abdominal binder, TEDs or ace wraps to BLE for this. If ineffective, consider salt     tabs,     midodrine or fludrocortisone.       Psych: psychology consult for adjustment to disability for pt and family     Pain Management:  control with oral medications if possible     Bladder:  serial PVRs to r/o retention/atonic bladder. in/out clean catherization if needed. Implement bladder program . Encourage self I&O cath training vs     indwelling foley if possible to improve mobility, reduce infection, and     increase safety     Bowel: stress ulcer ppx..  Implement mechanical and chemical bowel program if necessary and care  training with scheduled suppository 30 min to 1 hour after meals to utilize  gastrocolic and colorectal reflexes.   1. Does the need for close, 24 hr/day medical supervision in concert with the patient's rehab needs make it unreasonable for this patient to be served in a less intensive setting? Yes 2. Co-Morbidities requiring supervision/potential complications:  HTN (monitor and provide prns in accordance with increased physical exertion and pain), pain management (Biofeedback training with therapies to help reduce reliance on opiate pain medications, monitor pain control during therapies, and sedation at rest and titrate to maximum efficacy to ensure participation and gains in therapies), leukocytosis  (cont to monitor for signs and symptoms of infection, further workup if indicated) 3. Due to bladder management, safety, skin/wound care, disease management, pain management and patient education, does the patient require 24 hr/day rehab nursing? Yes 4. Does the patient require coordinated care of a physician, rehab nurse, PT (1-2 hrs/day, 5 days/week), OT (1-2 hrs/day, 5 days/week) and SLP (1-2 hrs/day, 5 days/week) to address physical and functional deficits in the context of the above medical diagnosis(es)? Yes Addressing deficits in the following areas: balance, endurance, locomotion, strength, transferring, bathing, dressing, grooming, toileting, swallowing and psychosocial support 5. Can the patient actively participate in an intensive therapy program of at least 3 hrs of therapy per day at least 5 days per week? Yes 6. The potential for patient to make measurable gains while on inpatient rehab is excellent 7. Anticipated functional outcomes upon discharge from inpatient rehab are supervision and min assist  with PT, supervision with OT, independent with SLP. 8. Estimated rehab length of stay to reach the above functional goals is: 14-17 days. 9. Does the patient have adequate social supports and living environment to accommodate these discharge functional goals? Yes 10. Anticipated D/C setting: Home 11. Anticipated post D/C treatments: HH therapy and Home excercise program 12. Overall Rehab/Functional Prognosis: excellent  RECOMMENDATIONS: This patient's condition is appropriate for continued rehabilitative care in the following setting: [facility].  Will await surgery and therapy evaluation thereafter to determine goals. Patient has agreed to participate in recommended program. Yes Note that insurance prior authorization may be required for reimbursement for recommended care.  Comment: Rehab Admissions Coordinator to follow up.  Delice Lesch, MD 09/29/2015    Revision History                         Routing History

## 2015-10-01 NOTE — H&P (Signed)
Physical Medicine and Rehabilitation Admission H&P    Chief Complaint  Patient presents with  . Central cord syndrome due to SCI    HPI: Terri Bradley is a 82 y.o female with history of hypertension otherwise in relatively good health. She was admitted on  09/26/2015 after a fall on an escalator while attending a convention meeting. She backwards down several steps and was unable to move BUE/BLE initially. On evaluation in ED she was able to move BLE but complained of burning and weakness BUE as well as pain at base of neck. MRI cervical spine revealed anterior superior endplate nondisplaced fracture C7, focal longitudinal ligament disruption at C6-C7 abnormal cord signal C7 concerning for cord contusion, moderate canal stenosis C5-C7 and R>L paraspinal and scalene muscle strain. Collar for stabilization and conservative care recommended by Dr. Annette Stable for management of central cord syndrome.  Patient continued to have weakness/numbness BUE and follow up flex-extensive views showed significant ligamentous instability at C6-C7.    Surgical intervention was recommended and she underwent ACDF C6-C7 on 08/28 by Dr. Ronnald Ramp. Post op has had problems voiding requiring in and out caths with volumes  At 600-900 cc.  Therapy resumed and patient with resultant deficits in balance, BUE weakness as well as difficulty with ADL tasks.  CIR recommended for follow up therapy.    Review of Systems  Constitutional: Negative for chills and fever.       Decreased appetite  HENT: Negative for hearing loss.   Eyes: Negative for blurred vision and double vision.  Respiratory: Negative for cough, shortness of breath and wheezing.   Cardiovascular: Negative for chest pain and palpitations.  Gastrointestinal: Positive for constipation. Negative for abdominal pain, heartburn and nausea.  Genitourinary: Negative for dysuria.       Has had difficulty with voiding since admission.   Musculoskeletal: Positive for back  pain, myalgias and neck pain (improving since surgery).  Skin: Negative for itching.  Neurological: Positive for tingling, speech change, focal weakness and weakness. Negative for dizziness and headaches.  Psychiatric/Behavioral: Negative for depression and memory loss. The patient has insomnia (chronic issues).      Past Medical History:  Diagnosis Date  . Hypertension     Past Surgical History:  Procedure Laterality Date  . ANTERIOR CERVICAL DECOMP/DISCECTOMY FUSION N/A 09/29/2015   Procedure: ANTERIOR CERVICAL DECOMPRESSION/DISCECTOMY CERVICAL SIX-SEVEN;  Surgeon: Eustace Moore, MD;  Location: [facility];  Service: Neurosurgery;  Laterality: N/A;  . CATARACT EXTRACTION    . colon tumor removal  2002   tumor removed "somewhere in colon, no treatment was ever needed."    History reviewed. No pertinent family history.    Social History:  Lives alone. Retired [employer]. She reports that she has never smoked. She has never used smokeless tobacco. She reports that she does not drink alcohol. Her drug history is not on file.    Allergies: No Known Allergies    Medications Prior to Admission  Medication Sig Dispense Refill  . calcium carbonate (OSCAL) 1500 (600 Ca) MG TABS tablet Take 1,500 mg by mouth 2 (two) times daily with a meal.    . Cholecalciferol (VITAMIN D) 2000 units tablet Take 2,000 Units by mouth daily.    Marland Kitchen lisinopril-hydrochlorothiazide (PRINZIDE,ZESTORETIC) 20-25 MG tablet Take 1 tablet by mouth daily.    . Multiple Vitamin (MULTIVITAMIN WITH MINERALS) TABS tablet Take 1 tablet by mouth every evening.      Home: Home Living Family/patient expects to  be discharged to:: Inpatient rehab Living Arrangements: Alone Available Help at Discharge: Friend(s), Available PRN/intermittently Type of Home: House Home Access: Stairs to enter [employer] of Steps: 1 Home Layout: One level Bathroom Shower/Tub: Tub/shower unit, Tourist information centre manager: Standard Home Equipment: Environmental consultant - 2 wheels, Bedside commode, Shower seat   Functional History: Prior Function Level of Independence: Independent  Functional Status:  Mobility: Bed Mobility Overal bed mobility: Needs Assistance Bed Mobility: Rolling, Sidelying to Sit Rolling: Min assist Sidelying to sit: Mod assist General bed mobility comments: Assist for elevation of trunk to sitting. Pt with L shoulder pain during bed mobility. Transfers Overall transfer level: Needs assistance Equipment used: 2 person hand held assist Transfers: Sit to/from Stand Sit to Stand: Mod assist Stand pivot transfers: Mod assist General transfer comment: Pt with increased independence today with transfers.  Two people there for safety but only needed 1 person assist for most of session. Ambulation/Gait Ambulation/Gait assistance: Min assist Ambulation Distance (Feet): 80 Feet Assistive device: Rolling walker (2 wheeled) Gait Pattern/deviations: Step-through pattern General Gait Details: mildly unsteady overall. Uncoordinated advance of L LE and appears less confident weightbearing in the R LE Gait velocity: slower Gait velocity interpretation: at or above normal speed for age/gender    ADL: ADL Overall ADL's : Needs assistance/impaired Eating/Feeding: Minimal assistance, Sitting Eating/Feeding Details (indicate cue type and reason): Pt only requires min assist at the end of meal when she becomes tired.  Built up handles will assist with this.  Pt unable to cut food at this time so set up required.  A rocker knife may be helpful if pt does not increase strength. Grooming: Minimal assistance, Standing Grooming Details (indicate cue type and reason): Pt stood at sink for 4 minutes with min assist for balance and min assist to brush teeth.  Pt dropped toothbrush and paste several times but was able to finish task with min assist. Upper Body Bathing: Sitting, Minimal assitance Upper Body Bathing  Details (indicate cue type and reason): Pt unable to squeeze out the washcloth fully with strength at 3+/5 in grip. Pt drops washcloth frequently.  Feel she can do without adaptive washcloth at this time to challenge her to hold to normal cloth. Lower Body Bathing: Maximal assistance, Sit to/from stand Lower Body Bathing Details (indicate cue type and reason): Pt was able to cross lower legs this am to bathe past her knee but still unable to reach feet without assist.   Upper Body Dressing : Moderate assistance, Sitting Upper Body Dressing Details (indicate cue type and reason): Pt requires total assist at this time to donn bra; unable to clasp or put over head due to weakness.  Pt can donn loose fitting shirt but unable to manage fasteners.  May need button hook; will see how pt progresses. Lower Body Dressing: Maximal assistance, Sit to/from stand Lower Body Dressing Details (indicate cue type and reason): Pt doffed socks with min assist today to hold leg crossed over so she could reach socks. Pt requires total assist to tie shoes and manage fasteners.  Toilet Transfer: Moderate assistance, Comfort height toilet, Grab bars, RW Toilet Transfer Details (indicate cue type and reason): Pt walked to bathroom with walker. Pt with incoordination of BLEs when ambulating. See PT notes. Toileting- Clothing Manipulation and Hygiene: Maximal assistance, Sitting/lateral lean Toileting - Clothing Manipulation Details (indicate cue type and reason): Pt can clean self after urinating but does not have strength or coordiation to do so after bowel movement. Functional mobility  during ADLs: Moderate assistance, Rolling walker General ADL Comments: Pt struggles with adls but very motivated to be independent.  Pt realized she could do more than she thought at beginning of session when she tried new things.  She was able to do most adls with mod assist and grooming/feeding with min assist.   Cognition: Cognition Overall  Cognitive Status: Within Functional Limits for tasks assessed Orientation Level: Oriented X4 Cognition Arousal/Alertness: Awake/alert Behavior During Therapy: WFL for tasks assessed/performed, Flat affect Overall Cognitive Status: Within Functional Limits for tasks assessed Area of Impairment: Memory, Safety/judgement, Problem solving Memory: Decreased short-term memory Safety/Judgement: Decreased awareness of deficits Problem Solving: Slow processing, Requires verbal cues, Requires tactile cues General Comments: Pt appears better cognitively today compared to 8/26.  Pt very motivated but limited in her UEs due to the nature of central cord injury.  Pt requires a great amount of assist for basic adls at this point but is motivated to work hard and increase her independence. Pt provided with built up handles to assist in feeding herself.  Pt can use a fork and spoon for short periods of time without the handles but does fatigue quickly so equipment provided to use at the end of meals to increase independence.     Blood pressure (!) 132/58, pulse 69, temperature 98.3 F (36.8 C), temperature source Oral, resp. rate 20, height 5\' 9"  ([id number] m), weight 79.4 kg (175 lb 0.7 oz), SpO2 98 %. Physical Exam  Nursing note and vitals reviewed. Constitutional: She is oriented to person, place, and time. She appears well-developed and well-nourished.  HENT:  Head: Normocephalic and atraumatic.  Mouth/Throat: Oropharynx is clear and moist.  Eyes: Conjunctivae are normal. Pupils are equal, round, and reactive to light. No scleral icterus.  Neck: Phonation normal. Immobilized by cervical collar--anterior incision with dry honeycomb dressing.   Cardiovascular: Normal rate and regular rhythm.   Respiratory: Effort normal and breath sounds normal. No stridor. No respiratory distress. She has no wheezes.  GI: Soft. Bowel sounds are normal. She exhibits no distension. There is no tenderness.  Musculoskeletal: She  exhibits no edema.  Neurological: She is alert and oriented to person, place, and time. No cranial nerve deficit.  Speech strong and clear.  Able to follow basic commands without difficulty.  Decreased sensation BUE.   Motor: RUE Shoulder abduction, elbow flexion, wrist extension 4+/5, elbow extension, hand grip 4/5 B/l UE Shoulder abduction, elbow flexion, wrist extension 4+/5, elbow extension, hand grip 4-/5 B/l LE: Hip flexion 4+/5, knee extension 4+/5, ankle dorsi/plantar flexion 4+/5 DTRs symmetric Skin: Skin is warm and dry. She is not diaphoretic. No erythema.  Psychiatric: She has a normal mood and affect. Her behavior is normal. Judgment and thought content normal.    Results for orders placed or performed during the hospital encounter of 09/26/15 (from the past 48 hour(s))  Surgical pcr screen     Status: None   Collection Time: 09/29/15  9:23 AM  Result Value Ref Range   MRSA, PCR NEGATIVE NEGATIVE   Staphylococcus aureus NEGATIVE NEGATIVE    Comment:        The Xpert SA Assay (FDA approved for NASAL specimens in patients over 9 years of age), is one component of a comprehensive surveillance program.  Test performance has been validated by [facility] for patients greater than or equal to 61 year old. It is not intended to diagnose infection nor to guide or monitor treatment.    Dg Cervical Spine 2-3 Views  Result Date: 09/29/2015 CLINICAL DATA:  Status post anterior fusion at C6 and C7 EXAM: DG C-ARM 61-120 MIN; CERVICAL SPINE - 2-3 VIEW COMPARISON:  September 28, 2015 FLUOROSCOPY TIME:  0 minutes 13 seconds; 2 acquired images FINDINGS: Frontal and lateral views were obtained. There is screw and plate fixation anteriorly at C6 and C7 with support hardware and disc spacer at C6-7 intact. No fracture or spondylolisthesis. There is moderate disc space narrowing at C5-6. There are anterior osteophytes at C4 and C5. IMPRESSION: Status post anterior screw and plate fixation at  C6 and C7 with alignment anatomic in this area. Moderate disc space narrowing C5-6. Anterior osteophytes at C4 and C5. No acute fracture or spondylolisthesis. Electronically Signed   By: Lowella Grip III M.D.   On: 09/29/2015 17:36   Dg C-arm 1-60 Min  Result Date: 09/29/2015 CLINICAL DATA:  Status post anterior fusion at C6 and C7 EXAM: DG C-ARM 61-120 MIN; CERVICAL SPINE - 2-3 VIEW COMPARISON:  September 28, 2015 FLUOROSCOPY TIME:  0 minutes 13 seconds; 2 acquired images FINDINGS: Frontal and lateral views were obtained. There is screw and plate fixation anteriorly at C6 and C7 with support hardware and disc spacer at C6-7 intact. No fracture or spondylolisthesis. There is moderate disc space narrowing at C5-6. There are anterior osteophytes at C4 and C5. IMPRESSION: Status post anterior screw and plate fixation at C6 and C7 with alignment anatomic in this area. Moderate disc space narrowing C5-6. Anterior osteophytes at C4 and C5. No acute fracture or spondylolisthesis. Electronically Signed   By: Lowella Grip III M.D.   On: 09/29/2015 17:36       Medical Problem List and Plan: 1.  Gait instability, poor coordination, decreased Hettinger secondary to Central cord syndrome. 2.  DVT Prophylaxis/Anticoagulation: Mechanical: Sequential compression devices, below knee Bilateral lower extremities and mobility.  3. Pain Management: will add low dose Neurontin to help with dysesthesias.  Hydrocodone prn effective.  4. Mood: Stable and motivated. LCSW to follow for evaluation and support.  5. Neuropsych: This patient is capable of making decisions on her own behalf. 6. Skin/Wound Care: routine pressure relief measures.  Encourage po intake--family encouraged to bring supplements from home.  7. Fluids/Electrolytes/Nutrition: Monitor I/O. Check lytes in am.  8. HTN:  Monitor BID--continue HCTZ/prinivil 9. Urinary retention: toilet patient every 4 hours. Monitor PVRs and cath for volumes > 350 cc. Check  UA/UCS.  10. Constipation: Will start senna at bedtime.    Post Admission Physician Evaluation: 1. Functional deficits secondary  to Central cord syndrome.. 2. Patient is admitted to receive collaborative, interdisciplinary care between the physiatrist, rehab nursing staff, and therapy team. 3. Patient's level of medical complexity and substantial therapy needs in context of that medical necessity cannot be provided at a lesser intensity of care such as a SNF. 4. Patient has experienced substantial functional loss from his/her baseline which was documented above under the "Functional History" and "Functional Status" headings.  Judging by the patient's diagnosis, physical exam, and functional history, the patient has potential for functional progress which will result in measurable gains while on inpatient rehab.  These gains will be of substantial and practical use upon discharge  in facilitating mobility and self-care at the household level. 5. Physiatrist will provide 24 hour management of medical needs as well as oversight of the therapy plan/treatment and provide guidance as appropriate regarding the interaction of the two. 6. 24 hour rehab nursing will assist with bladder management, safety, skin/wound care, disease  management, pain management and patient education  and help integrate therapy concepts, techniques,education, etc. 7. PT will assess and treat for/with: Lower extremity strength, range of motion, stamina, balance, functional mobility, safety, adaptive techniques and equipment, woundcare, coping skills, pain control, education.   Goals are: Supervision. 8. OT will assess and treat for/with: ADL's, functional mobility, safety, upper extremity strength, adaptive techniques and equipment, wound mgt, ego support, and community reintegration.   Goals are: Supervision/Min A. Therapy may not proceed with showering this patient. 9. Case Management and Social Worker will assess and treat for  psychological issues and discharge planning. 10. Team conference will be held weekly to assess progress toward goals and to determine barriers to discharge. 11. Patient will receive at least 3 hours of therapy per day at least 5 days per week. 12. ELOS: 14-18 days.       13. Prognosis:  good  Delice Lesch, MD 10/01/2015

## 2015-10-02 ENCOUNTER — Inpatient Hospital Stay (HOSPITAL_COMMUNITY): Payer: Medicare Other | Admitting: Physical Therapy

## 2015-10-02 ENCOUNTER — Inpatient Hospital Stay (HOSPITAL_COMMUNITY): Payer: Self-pay | Admitting: Physical Therapy

## 2015-10-02 ENCOUNTER — Inpatient Hospital Stay (HOSPITAL_COMMUNITY): Payer: Self-pay | Admitting: Occupational Therapy

## 2015-10-02 LAB — COMPREHENSIVE METABOLIC PANEL
ALK PHOS: 69 U/L (ref 38–126)
ALT: 49 U/L (ref 14–54)
ANION GAP: 8 (ref 5–15)
AST: 45 U/L — ABNORMAL HIGH (ref 15–41)
Albumin: 2.5 g/dL — ABNORMAL LOW (ref 3.5–5.0)
BILIRUBIN TOTAL: 0.5 mg/dL (ref 0.3–1.2)
BUN: 17 mg/dL (ref 6–20)
CALCIUM: 8.6 mg/dL — AB (ref 8.9–10.3)
CO2: 31 mmol/L (ref 22–32)
Chloride: 97 mmol/L — ABNORMAL LOW (ref 101–111)
Creatinine, Ser: 0.69 mg/dL (ref 0.44–1.00)
GLUCOSE: 97 mg/dL (ref 65–99)
Potassium: 3.6 mmol/L (ref 3.5–5.1)
Sodium: 136 mmol/L (ref 135–145)
TOTAL PROTEIN: 5.9 g/dL — AB (ref 6.5–8.1)

## 2015-10-02 LAB — CBC WITH DIFFERENTIAL/PLATELET
Basophils Absolute: 0.1 10*3/uL (ref 0.0–0.1)
Basophils Relative: 1 %
Eosinophils Absolute: 0.6 10*3/uL (ref 0.0–0.7)
Eosinophils Relative: 6 %
HEMATOCRIT: 31.4 % — AB (ref 36.0–46.0)
HEMOGLOBIN: 10 g/dL — AB (ref 12.0–15.0)
LYMPHS ABS: 2.3 10*3/uL (ref 0.7–4.0)
Lymphocytes Relative: 23 %
MCH: 29.2 pg (ref 26.0–34.0)
MCHC: 31.8 g/dL (ref 30.0–36.0)
MCV: 91.8 fL (ref 78.0–100.0)
MONOS PCT: 12 %
Monocytes Absolute: 1.2 10*3/uL — ABNORMAL HIGH (ref 0.1–1.0)
NEUTROS ABS: 5.8 10*3/uL (ref 1.7–7.7)
NEUTROS PCT: 58 %
Platelets: 253 10*3/uL (ref 150–400)
RBC: 3.42 MIL/uL — ABNORMAL LOW (ref 3.87–5.11)
RDW: 13.9 % (ref 11.5–15.5)
WBC: 10 10*3/uL (ref 4.0–10.5)

## 2015-10-02 NOTE — Progress Notes (Signed)
Patient information reviewed and entered into eRehab system by Kelden Lavallee, RN, CRRN, PPS Coordinator.  Information including medical coding and functional independence measure will be reviewed and updated through discharge.     Per nursing patient was given "Data Collection Information Summary for Patients in Inpatient Rehabilitation Facilities with attached "Privacy Act Statement-Health Care Records" upon admission.  

## 2015-10-02 NOTE — Evaluation (Signed)
Physical Therapy Assessment and Plan  Patient Details  Name: Terri Bradley MRN: [id number] Date of Birth: 03/25/1927  PT Diagnosis: Abnormality of gait, Difficulty walking, Impaired sensation and Muscle weakness Rehab Potential: Excellent ELOS: 7-10 days   Today's Date: 10/02/2015 PT Individual Time: 0800-0900 PT Individual Time Calculation (min): 60 min     Problem List:  Patient Active Problem List   Diagnosis Date Noted  . Central cord syndrome at C6 level of cervical spinal cord ([place]) 10/01/2015  . Neurologic gait disorder   . Neuropathic pain   . Post-operative pain   . Neurogenic bladder   . Neurogenic bowel   . Cervical vertebral fusion 09/29/2015  . Spinal cord injury at C5-C7 level without injury of spinal bone ([place])   . Benign essential HTN   . Acute neck pain   . Leukocytosis   . Contusion of cervical cord ([place]) 09/26/2015  . Central cord syndrome [facility]) 09/26/2015    Past Medical History:  Past Medical History:  Diagnosis Date  . Colon cancer ([place]) 2002  . Colon cancer (HCC)--stage 1   . Diverticulosis   . HTN (hypertension)   . Hypertension    Past Surgical History:  Past Surgical History:  Procedure Laterality Date  . ANTERIOR CERVICAL DECOMP/DISCECTOMY FUSION N/A 09/29/2015   Procedure: ANTERIOR CERVICAL DECOMPRESSION/DISCECTOMY CERVICAL SIX-SEVEN;  Surgeon: Eustace Moore, MD;  Location: [place] [facility];  Service: Neurosurgery;  Laterality: N/A;  . BREAST BIOPSY Left 2013   core - neg  . CATARACT EXTRACTION    . colon tumor removal  2002   tumor removed "somewhere in colon, no treatment was ever needed."  . PARTIAL COLECTOMY Right 2010/002  . VAGINAL HYSTERECTOMY  1975   with oopherectomy    Assessment & Plan Clinical Impression: Terri Harkless Mcphersonis a 82 y.o.right handed femalewith history of hypertension. Per chart review patient lives alone independent prior to admission. Presented 09/26/2015 after a fall on an escalator while attending a  convention meeting. Reported she fell backwards down several steps to the escalator. Complaints of burning sensation in all extremities. Denied loss of consciousness. Cranial CT scan negative. CT/MRI cervical spine shows cervical spondylosis at C5-6 and 6-7 but no obvious fracture. There was a small area of signal change within the cord at C6-7 with no ongoing cord compression. Lateral plain film showed subluxation of C6-7 measuring about 5 mm that were not there on her CT or MRI upon admission. Await plan for possible surgical intervention.. Maintain in [place] cervical collar. Hospital course pain management. Physical occupational therapy evaluations completed with recommendations of physical medicine rehabilitation consult.  Patient transferred to CIR on 10/01/2015 .   Patient currently requires min with mobility secondary to muscle weakness, decreased cardiorespiratoy endurance and decreased standing balance, decreased postural control and decreased balance strategies.  Prior to hospitalization, patient was independent  with mobility and lived with Alone in a House home.  Home access is 1Stairs to enter.  Patient will benefit from skilled PT intervention to maximize safe functional mobility and minimize fall risk for planned discharge home with intermittent assist.  Anticipate patient will benefit from follow up [facility] at discharge.  PT - End of Session Activity Tolerance: Tolerates 30+ min activity with multiple rests Endurance Deficit: Yes Endurance Deficit Description: requires several seated rest breaks d/t fatigue PT Assessment Rehab Potential (ACUTE/IP ONLY): Excellent Barriers to Discharge: Decreased caregiver support PT Patient demonstrates impairments in the following area(s): Balance;Safety;Endurance;Motor PT Transfers Functional Problem(s): Bed Mobility;Bed to Chair;Car;Floor;Furniture  PT Locomotion Functional Problem(s): Ambulation;Stairs PT Plan PT Intensity: Minimum of 1-2 x/day ,45 to  90 minutes PT Frequency: 5 out of 7 days PT Duration Estimated Length of Stay: 7-10 days PT Treatment/Interventions: Functional mobility training;Neuromuscular re-education;Patient/family education;Stair training;Therapeutic Exercise;UE/LE Coordination activities;UE/LE Strength taining/ROM;Therapeutic Activities;Psychosocial support;Disease management/prevention;Community reintegration;Balance/vestibular training;Ambulation/gait training;DME/adaptive equipment instruction;Discharge planning PT Transfers Anticipated Outcome(s): modI PT Locomotion Anticipated Outcome(s): modI PT Recommendation Follow Up Recommendations: Home health PT Patient destination: Home Equipment Recommended: To be determined Equipment Details: has RW  Skilled Therapeutic Intervention Pt received supine in bed, c/o pain as below and agreeable to treatment. Initial PT evaluation performed and completed with minA as described below. Pt close S with transfers and gait with RW, however min guard without AD. Pt limited by poor dynamic standing balance, mild LE strength deficits, BUE strength/coordination deficits and limited activity tolerance. Pt educated in rehab process, goals, estimated length of stay, and falls prevention safety with recommendation that pt ask for assist prior to getting up; pt agreeable to all the above. Remained seated in recliner at end of session, all needs in reach.   PT Evaluation Precautions/Restrictions Precautions Precautions: Fall;Cervical Required Braces or Orthoses: Cervical Brace Cervical Brace: Hard collar;At all times Restrictions Weight Bearing Restrictions: No General Chart Reviewed: Yes Response to Previous Treatment: Not applicable Family/Caregiver Present: No  Pain Pain Assessment Pain Score: 8  Pain Type: Neuropathic pain Pain Location: Hand Pain Orientation: Right;Left Pain Descriptors / Indicators: Numbness;Tingling Pain Frequency: Constant Pain Onset: On-going Patients  Stated Pain Goal: 4 Pain Intervention(s): Medication (See eMAR) Home Living/Prior Functioning Home Living Living Arrangements: Alone Available Help at Discharge: Friend(s);Available PRN/intermittently Type of Home: House Home Access: Stairs to enter Entrance Stairs-Number of Steps: 1 Home Layout: One level Bathroom Shower/Tub: Tub/shower unit;Walk-in shower Bathroom Toilet: Associate Professor Accessibility: Yes  Lives With: Alone Vision/Perception    WFL  Cognition Overall Cognitive Status: Within Functional Limits for tasks assessed Orientation Level: Oriented X4 Memory: Appears intact Awareness: Appears intact Problem Solving: Appears intact Safety/Judgment: Appears intact Sensation Sensation Light Touch: Appears Intact Stereognosis: Not tested Hot/Cold: Not tested Proprioception: Appears Intact Coordination Gross Motor Movements are Fluid and Coordinated: Yes Heel Shin Test: WFL BLE Motor  Motor Motor - Skilled Clinical Observations: paraparesis BUEs  Mobility Bed Mobility Bed Mobility: Supine to Sit;Sit to Supine Supine to Sit: 4: Min assist Supine to Sit Details: Verbal cues for technique;Verbal cues for sequencing;Tactile cues for weight shifting;Verbal cues for precautions/safety Sit to Supine: 4: Min assist Sit to Supine - Details: Verbal cues for technique;Verbal cues for precautions/safety;Verbal cues for sequencing Transfers Transfers: Yes Sit to Stand: 4: Min guard Stand to Sit: 4: Min assist Stand to Sit Details (indicate cue type and reason): Verbal cues for technique Stand to Sit Details: cues for eccentric control Stand Pivot Transfers: 4: Min assist Stand Pivot Transfer Details: Verbal cues for technique;Verbal cues for precautions/safety;Verbal cues for sequencing Locomotion  Ambulation Ambulation: Yes Ambulation/Gait Assistance: 4: Min guard Ambulation Distance (Feet): 200 Feet Assistive device: None;Rolling walker Gait Gait: Yes Gait  Pattern: Impaired Gait Pattern: Shuffle;Trunk flexed;Poor foot clearance - left;Poor foot clearance - right Gait velocity: decreased for age/gender norms Stairs / Additional Locomotion Stairs: Yes Stairs Assistance: 4: Min assist Stairs Assistance Details: Verbal cues for sequencing;Verbal cues for technique Stairs Assistance Details (indicate cue type and reason): RLE buckled during descent on 6" step Stair Management Technique: Two rails;Alternating pattern;Forwards Number of Stairs: 4 Height of Stairs: 6 Ramp: 4: Min assist Curb: 4: Min Mining engineer  Mobility Wheelchair Mobility: No  Trunk/Postural Assessment  Cervical Assessment Cervical Assessment: Exceptions to [facility] (Hard collar) Thoracic Assessment Thoracic Assessment: Exceptions to [facility] (Kyphotic) Lumbar Assessment Lumbar Assessment: Exceptions to [facility] (Posterior pelvic tilt) Postural Control Postural Control: Within Functional Limits (Generalized flexed/ slouched posture, however, WFL)  Balance Balance Balance Assessed: Yes Standardized Balance Assessment Standardized Balance Assessment: Berg Balance Test Berg Balance Test Sit to Stand: Able to stand without using hands and stabilize independently Standing Unsupported: Able to stand 2 minutes with supervision Sitting with Back Unsupported but Feet Supported on Floor or Stool: Able to sit safely and securely 2 minutes Stand to Sit: Sits independently, has uncontrolled descent Transfers: Able to transfer with verbal cueing and /or supervision Standing Unsupported with Eyes Closed: Able to stand 10 seconds with supervision Standing Ubsupported with Feet Together: Able to place feet together independently and stand for 1 minute with supervision From Standing, Reach Forward with Outstretched Arm: Reaches forward but needs supervision From Standing Position, Pick up Object from Floor: Unable to try/needs assist to keep balance From Standing Position, Turn to Look Behind  Over each Shoulder: Looks behind one side only/other side shows less weight shift Turn 360 Degrees: Able to turn 360 degrees safely but slowly Standing Unsupported, Alternately Place Feet on Step/Stool: Able to complete >2 steps/needs minimal assist Standing Unsupported, One Foot in Front: Able to plae foot ahead of the other independently and hold 30 seconds Standing on One Leg: Tries to lift leg/unable to hold 3 seconds but remains standing independently Total Score: 31 Static Sitting Balance Static Sitting - Balance Support: Feet supported;No upper extremity supported Static Sitting - Level of Assistance: 6: Modified independent (Device/Increase time) Dynamic Sitting Balance Dynamic Sitting - Balance Support: Feet supported;No upper extremity supported Dynamic Sitting - Level of Assistance: 5: Stand by assistance Dynamic Sitting - Balance Activities: Lateral lean/weight shifting;Forward lean/weight shifting;Reaching across midline Static Standing Balance Static Standing - Balance Support: No upper extremity supported Static Standing - Level of Assistance: 5: Stand by assistance Static Stance: Eyes closed Static Stance: Eyes Closed: x30 sec S Dynamic Standing Balance Dynamic Standing - Balance Support: No upper extremity supported;During functional activity Dynamic Standing - Level of Assistance: 4: Min assist Dynamic Standing - Balance Activities: Lateral lean/weight shifting;Forward lean/weight shifting;Reaching across midline Extremity Assessment  RUE Assessment RUE Assessment: Exceptions to [facility] RUE AROM (degrees) Overall AROM Right Upper Extremity: Within functional limits for tasks performed (4/5 overall; 3-/5 elbow flexion) RUE Strength RUE Overall Strength: Deficits (4/5 overall; 3-/5 elbow flexion) LUE Assessment LUE Assessment: Exceptions to WFL LUE AROM (degrees) Overall AROM Left Upper Extremity: Within functional limits for tasks assessed LUE Strength LUE Overall  Strength: Deficits (4/5 overall; 3-/5 elbow flexion) RLE Assessment RLE Assessment: Exceptions to [facility] (hip flexion 4/5, knee extension/flexion 4/5, ankle dorsiflexion/plantarflexion 4+/5) LLE Assessment LLE Assessment: Exceptions to [facility] (hip flexion 4/5, knee extension/flexion 4+/5 ankle dorsiflexion/plantarflexion 4+/5)   See Function Navigator for Current Functional Status.   Refer to Care Plan for Long Term Goals  Recommendations for other services: None  Discharge Criteria: Patient will be discharged from PT if patient refuses treatment 3 consecutive times without medical reason, if treatment goals not met, if there is a change in medical status, if patient makes no progress towards goals or if patient is discharged from hospital.  The above assessment, treatment plan, treatment alternatives and goals were discussed and mutually agreed upon: by patient  Luberta Mutter 10/02/2015, 10:21 AM

## 2015-10-02 NOTE — Evaluation (Signed)
Occupational Therapy Assessment and Plan  Patient Details  Name: Terri Bradley MRN: [id number] Date of Birth: 08-Sep-1927  OT Diagnosis: abnormal posture, acute pain, muscle weakness (generalized) and quadriparesis at level C6-C7 Rehab Potential: Rehab Potential (ACUTE ONLY): Good ELOS: 7-10 days   Today's Date: 10/02/2015 OT Individual Time: 0930-1100 and 1415-1455 OT Individual Time Calculation (min): 90 and 40 min      Problem List:  Patient Active Problem List   Diagnosis Date Noted  . Central cord syndrome at C6 level of cervical spinal cord ([place]) 10/01/2015  . Neurologic gait disorder   . Neuropathic pain   . Post-operative pain   . Neurogenic bladder   . Neurogenic bowel   . Cervical vertebral fusion 09/29/2015  . Spinal cord injury at C5-C7 level without injury of spinal bone ([place])   . Benign essential HTN   . Acute neck pain   . Leukocytosis   . Contusion of cervical cord ([place]) 09/26/2015  . Central cord syndrome [facility]) 09/26/2015    Past Medical History:  Past Medical History:  Diagnosis Date  . Colon cancer ([place]) 2002  . Colon cancer (HCC)--stage 1   . Diverticulosis   . HTN (hypertension)   . Hypertension    Past Surgical History:  Past Surgical History:  Procedure Laterality Date  . ANTERIOR CERVICAL DECOMP/DISCECTOMY FUSION N/A 09/29/2015   Procedure: ANTERIOR CERVICAL DECOMPRESSION/DISCECTOMY CERVICAL SIX-SEVEN;  Surgeon: Eustace Moore, MD;  Location: [place] [facility];  Service: Neurosurgery;  Laterality: N/A;  . BREAST BIOPSY Left 2013   core - neg  . CATARACT EXTRACTION    . colon tumor removal  2002   tumor removed "somewhere in colon, no treatment was ever needed."  . PARTIAL COLECTOMY Right 2010/002  . VAGINAL HYSTERECTOMY  1975   with oopherectomy    Assessment & Plan Clinical Impression: Terri Bradley a 82 y.o femalewith history of hypertension otherwise in relatively good health. She was admitted on 09/26/2015 after a fall on an  escalator while attending a convention meeting. She backwards down several steps and was unable to move BUE/BLE initially. On evaluation in ED she was able to move BLE but complained of burning and weakness BUE as well as pain at base of neck. MRI cervical spine revealed anterior superior endplate nondisplaced fracture C7, focal longitudinal ligament disruption at C6-C7 abnormal cord signal C7 concerning for cord contusion, moderate canal stenosis C5-C7 and R>L paraspinal and scalene muscle strain. Collar for stabilization and conservative care recommended by Dr. Annette Stable for management of central cord syndrome. Patient continued to have weakness/numbness BUE and follow up flex-extensive views showed significant ligamentous instability at C6-C7.   Surgical intervention was recommended and she underwent ACDF C6-C7 on 08/28 by Dr. Ronnald Ramp. Post op has had problems voiding requiring in and out caths with volumes At 600-900 cc.  Patient transferred to CIR on 10/01/2015 .    Patient currently requires min with basic self-care skills secondary to muscle weakness and muscle paralysis, decreased cardiorespiratoy endurance and decreased standing balance, decreased postural control and decreased balance strategies.  Prior to hospitalization, patient could complete ADLs/IADLs with independent .  Patient will benefit from skilled intervention to increase independence with basic self-care skills and increase level of independence with iADL prior to discharge home independently.  Anticipate patient will require intermittent supervision and follow up home health.  OT - End of Session Activity Tolerance: Tolerates 30+ min activity with multiple rests Endurance Deficit: Yes OT Assessment Rehab Potential (ACUTE ONLY):  Good Barriers to Discharge: Decreased caregiver support Barriers to Discharge Comments: Family only able to provide intermittent supervision; pt will be alone during day OT Patient demonstrates impairments  in the following area(s): Balance;Endurance;Motor;Pain;Perception;Sensory;Safety OT Basic ADL's Functional Problem(s): Bathing;Grooming;Eating;Dressing;Toileting OT Advanced ADL's Functional Problem(s): Simple Meal Preparation;Laundry;Light Housekeeping OT Transfers Functional Problem(s): Toilet;Tub/Shower OT Additional Impairment(s): Fuctional Use of Upper Extremity OT Plan OT Intensity: Minimum of 1-2 x/day, 45 to 90 minutes OT Frequency: 5 out of 7 days OT Duration/Estimated Length of Stay: 7-10 days OT Treatment/Interventions: Balance/vestibular training;Community reintegration;Discharge planning;DME/adaptive equipment instruction;Neuromuscular re-education;Functional mobility training;Pain management;Functional electrical stimulation;Patient/family education;Psychosocial support;Skin care/wound managment;Self Care/advanced ADL retraining;Therapeutic Activities;Therapeutic Exercise;UE/LE Strength taining/ROM;UE/LE Coordination activities OT Self Feeding Anticipated Outcome(s): Mod I OT Basic Self-Care Anticipated Outcome(s): Mod I OT Toileting Anticipated Outcome(s): Mod I OT Bathroom Transfers Anticipated Outcome(s): Supervision- Mod I OT Recommendation Recommendations for Other Services: Neuropsych consult Patient destination: Home Follow Up Recommendations: Home health OT Equipment Recommended: To be determined   Skilled Therapeutic Intervention Session One: Pt seen for OT eval and tx session. Pt sitting up in recliner upon arrival, agreeable to tx session. Pt not yet cleared for showering task and therefore bathing/dressing completed from w/c level at sink. She ambulated throughout room with HHA to completed toileting transfer and task. She self propelled w/c portion of way to therapy gym for UE strengthening. 9 hole peg test and UE strengthening assessment completed, see results below. Pt provided with soft "tan" theraputy. Demonstrated and completed exercises addressing various  pinch positions. She returned to room ambulating pushing w/c with CGA and VCs for safety. Pt left sittingin recliner at end of session, all needs in reach. Pt very discouraged by "lack of return" and believes she requires more assist than she actually does and asks for assist before attempting.  With encouragement, pt willing to attempt tasks.  Educated throughout session regarding role of OT, CIR, OT/PT goals, POC, and d/c planning.   Session Two: Pt seen for OT session focusing on fine motor coordination and functional standing balance. Pt sitting up in recliner upon arrival, agreeable to tx session.  In therapy day room, pt completed card playing task with emphasis on pt managing playing cards in each hand and pincer grasp to remove cards. Pt then completed card game while standing on non-compliant surface with min A, tolerating ~5 minutes of standing before requesting seated rest break. Pt ambulated back to room pushing w/c at end of session. She returned to supine and left with all needs in reach.     OT Evaluation Precautions/Restrictions  Precautions Precautions: Fall;Cervical Required Braces or Orthoses: Cervical Brace Cervical Brace: Hard collar;At all times Restrictions Weight Bearing Restrictions: No General Chart Reviewed: Yes Pain Pain Assessment Pain Score: No/ denies pain Home Living/Prior Functioning Home Living Living Arrangements: Alone Available Help at Discharge: Friend(s), Available PRN/intermittently Type of Home: House Home Access: Stairs to enter [employer] of Steps: 1 Home Layout: One level Bathroom Shower/Tub: Tub/shower unit, Multimedia programmer: Standard Bathroom Accessibility: Yes  Lives With: Alone Vision/Perception  Vision- History Baseline Vision/History: Cataracts;Wears glasses Wears Glasses: Distance only Patient Visual Report: No change from baseline Vision- Assessment Vision Assessment?: No apparent visual deficits   Cognition Overall Cognitive Status: Within Functional Limits for tasks assessed Arousal/Alertness: Awake/alert Orientation Level: Person;Place;Situation Person: Oriented Place: Oriented Situation: Oriented Year: 2017 Month: August Day of Week: Correct Memory: Appears intact Immediate Memory Recall: Sock;Blue;Bed Memory Recall: Sock;Blue;Bed Memory Recall Sock: Without Cue Memory Recall Blue: Without Cue Memory Recall Bed:  Without Cue Awareness: Appears intact Problem Solving: Appears intact Safety/Judgment: Appears intact Sensation Sensation Light Touch: Impaired Detail Light Touch Impaired Details: Impaired RUE;Impaired LUE Additional Comments: Reports tingling/ numbness in B hands/ fingers Coordination Gross Motor Movements are Fluid and Coordinated: Yes Fine Motor Movements are Fluid and Coordinated: Yes Finger Nose Finger Test: WFL with slightly increased time 9 Hole Peg Test: R: 35.19sec 35.49 sec, and 33.69 AVG: 34.79   L: 42.99sec, 39.99 sec, and 41.82 sec AVG: 41.6 sec Motor  Motor Motor: Within Functional Limits Motor - Skilled Clinical Observations: paraparesis BUEs Trunk/Postural Assessment  Cervical Assessment Cervical Assessment: Exceptions to [facility] (Hard collar) Thoracic Assessment Thoracic Assessment: Exceptions to [facility] (Kyphotic) Lumbar Assessment Lumbar Assessment: Exceptions to [facility] (Posterior pelvic tilt) Postural Control Postural Control: Within Functional Limits (Generalized flexed/ slouched posture, however, WFL)  Balance Balance Balance Assessed: Yes Static Sitting Balance Static Sitting - Balance Support: Feet supported;No upper extremity supported Static Sitting - Level of Assistance: 6: Modified independent (Device/Increase time) Dynamic Sitting Balance Dynamic Sitting - Balance Support: Feet supported;No upper extremity supported Dynamic Sitting - Level of Assistance: 5: Stand by assistance Static Standing Balance Static Standing - Balance  Support: No upper extremity supported Static Standing - Level of Assistance: 5: Stand by assistance;4: Min assist Dynamic Standing Balance Dynamic Standing - Balance Support: No upper extremity supported;During functional activity Dynamic Standing - Level of Assistance: 4: Min assist Dynamic Standing - Balance Activities: Lateral lean/weight shifting;Forward lean/weight shifting;Reaching across midline Extremity/Trunk Assessment RUE Assessment RUE Assessment: Exceptions to [facility] RUE AROM (degrees) Overall AROM Right Upper Extremity: Within functional limits for tasks performed (4/5 overall; 3-/5 elbow flexion) RUE Strength RUE Overall Strength: Deficits (4/5 overall; 3-/5 elbow flexion) 5lbs grip strength using dynamometer LUE Assessment LUE Assessment: Exceptions to WFL LUE AROM (degrees) Overall AROM Left Upper Extremity: Within functional limits for tasks assessed LUE Strength LUE Overall Strength: Deficits (4/5 overall; 3-/5 elbow flexion) 5lbs grip strength using dynamometer   See Function Navigator for Current Functional Status.   Refer to Care Plan for Long Term Goals  Recommendations for other services: Neuropsych  Discharge Criteria: Patient will be discharged from OT if patient refuses treatment 3 consecutive times without medical reason, if treatment goals not met, if there is a change in medical status, if patient makes no progress towards goals or if patient is discharged from hospital.  The above assessment, treatment plan, treatment alternatives and goals were discussed and mutually agreed upon: by patient  Ernestina Patches 10/02/2015, 3:21 PM

## 2015-10-02 NOTE — Progress Notes (Signed)
Physical Therapy Session Note  Patient Details  Name: Terri Bradley MRN: [id number] Date of Birth: 04-28-1927  Today's Date: 10/02/2015 PT Individual Time: 0737-1062 PT Individual Time Calculation (min): 40 min    Short Term Goals: Week 1:  PT Short Term Goal 1 (Week 1): Pt will perform bed mobility with S PT Short Term Goal 2 (Week 1): Pt will perform stand pivot transfers with S PT Short Term Goal 3 (Week 1): Pt will ambulate 200' with S and LRAD PT Short Term Goal 4 (Week 1): Pt will ascend/descend 12 stairs with BUE and S  Skilled Therapeutic Interventions/Progress Updates:    Pt received resting in bed, no c/o pain but reports her hands continue to bother her, agreeable to therapy session.  Pt transitioned supine>sit EOB with supervision.  Sit<>stand throughout session with HHA or no device and supervision.  Gait training to/from therapy gym and throughout unit with HHA and supervision, gait in room without device and supervision.  PT administered TUG with pt scoring an average of 15.03 seconds across 3 trials.  Educated pt on increased risk of falls and normative data (13.5 seconds).  Nustep x8 minutes at level 4 for NMR, reciprocal stepping pattern, forced use of LEs/UEs, and overall endurance.  Pt returned to room at end of session and requesting to use bathroom; pt performed clothing management, hygiene, and transfers, with distant supervision.  Pt positioned in recliner at end of session with call bell in reach and needs met.   Therapy Documentation Precautions:  Precautions Precautions: Fall, Cervical Required Braces or Orthoses: Cervical Brace Cervical Brace: Hard collar, At all times Restrictions Weight Bearing Restrictions: No   See Function Navigator for Current Functional Status.   Therapy/Group: Individual Therapy  Earnest Conroy Penven-Crew 10/02/2015, 8:18 PM

## 2015-10-02 NOTE — Progress Notes (Signed)
Social Work Social Work Assessment and Plan  Patient Details  Name: Terri Bradley MRN: [id number] Date of Birth: [birth year]  Today's Date: 10/02/2015  Problem List:  Patient Active Problem List   Diagnosis Date Noted  . Central cord syndrome at C6 level of cervical spinal cord ([place]) 10/01/2015  . Neurologic gait disorder   . Neuropathic pain   . Post-operative pain   . Neurogenic bladder   . Neurogenic bowel   . Cervical vertebral fusion 09/29/2015  . Spinal cord injury at C5-C7 level without injury of spinal bone ([place])   . Benign essential HTN   . Acute neck pain   . Leukocytosis   . Contusion of cervical cord ([place]) 09/26/2015  . Central cord syndrome [facility]) 09/26/2015   Past Medical History:  Past Medical History:  Diagnosis Date  . Colon cancer ([place]) 2002  . Colon cancer (HCC)--stage 1   . Diverticulosis   . HTN (hypertension)   . Hypertension    Past Surgical History:  Past Surgical History:  Procedure Laterality Date  . ANTERIOR CERVICAL DECOMP/DISCECTOMY FUSION N/A 09/29/2015   Procedure: ANTERIOR CERVICAL DECOMPRESSION/DISCECTOMY CERVICAL SIX-SEVEN;  Surgeon: Eustace Moore, MD;  Location: [facility];  Service: Neurosurgery;  Laterality: N/A;  . BREAST BIOPSY Left 2013   core - neg  . CATARACT EXTRACTION    . colon tumor removal  2002   tumor removed "somewhere in colon, no treatment was ever needed."  . PARTIAL COLECTOMY Right 2010/002  . VAGINAL HYSTERECTOMY  1975   with oopherectomy   Social History:  reports that she has never smoked. She has never used smokeless tobacco. She reports that she does not drink alcohol. Her drug history is not on file.  Family / Support Systems Marital Status: Widow/Widower How Long?: 6 yrs Patient Roles: Parent, Volunteer Children: daughter (only child), Terri Bradley @ (H) [phone] or (C) [phone] Other Supports: pt's sister and grandson live close by and can provide intermittent assist Anticipated Caregiver: Daughter  and family at night. They have not arranged supervision during the day Ability/Limitations of Caregiver: Daughter works. Grandson works nights Caregiver Availability: Intermittent Family Dynamics: Pt describes family as supportive but they are working and sister still very active in the community.  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  Pt was one of 3 injured with malfunctioning escalator. Guardian/Conservator: None - per MD, pt is capable of making decisions on her own behalf.   Abuse/Neglect Physical Abuse: Denies Verbal Abuse: Denies Sexual Abuse: Denies Exploitation of patient/patient's resources: Denies Self-Neglect: Denies  Emotional Status [name] affect, behavior adn adjustment status: Pt pleasant and able to complete assessment interview without difficulty.  Very matter-of-fact with her answers and not very talkative.  She denies any significant emotional distress but admits frustrations with her resulting limitations due to this accident.  She reports that she is very active and independent in the community and concerned she may not be able to resume that same level of activity.  Denies any s/s of emotional distress but will monitor and refer for neuropsychology as indicated.  Recent Psychosocial Issues: None Pyschiatric History: None Substance Abuse History: None  Patient / Family Perceptions, Expectations & Goals Pt/Family understanding of illness & functional limitations: Pt and daughter with good understanding of her injuries, surgery and current functional limitations/ need  for CIR. Premorbid pt/family roles/activities: Pt very active in several community organizations and with her church.  Completely independent. Anticipated changes in  roles/activities/participation: Therapists are anticipating mod ind goals.  Daughter and other family may need to assume some caregiver responsibilities/  intermittent support needs. Pt/family expectations/goals: "I hope it won't be too long that I can't drive."  [employer]: None Premorbid Home Care/DME Agencies: None Transportation available at discharge: yes  Discharge Planning Living Arrangements: [place]: Children, Other relatives, Water engineer, Social worker community Type of Residence: Private residence Insurance Resources: [employer] ([place] Medicare and 3rd party liability) Financial Resources: [place] Referred: No Living Expenses: Own Money Management: Patient Does the patient have any problems obtaining your medications?: No Home Management: pt Patient/Family Preliminary Plans: Pt plans to return to her own home with intermittent assist of family. Social Work Anticipated Follow Up Needs: HH/OP Expected length of stay: ?7 days  Clinical Impression Pleasant, elderly woman here after suffering SCI from and escalator accident.  She denies any significant emotional distress but does admit much frustration with her limitations.  Very active and involved in many local organizations PTA.  Good family support but family does not have a plan for 24/7 assist if needed.  Therapies anticipated shorter LOS with potential for mod ind goals. Will follow for support and d/c planning needs.  Terri Bradley 10/02/2015, 1:43 PM

## 2015-10-02 NOTE — Progress Notes (Signed)
[facility] [place] PHYSICAL MEDICINE & REHABILITATION     PROGRESS NOTE    Subjective/Complaints: Had a reasonable night. emptyng bladder better. Bowels are slow--moved yesterday. Pain seems to be controlled.   ROS: Pt denies fever, rash/itching, headache, blurred or double vision, nausea, vomiting, abdominal pain, diarrhea, chest pain, shortness of breath, palpitations, dysuria, dizziness,   back pain, bleeding, anxiety, or depression   Objective: Vital Signs: Blood pressure 136/66, pulse 63, temperature 98.3 F (36.8 C), temperature source Oral, resp. rate 16, height 5\' 9"  (1.753 m), weight 82.4 kg (181 lb 11.2 oz), SpO2 94 %. No results found.  Recent Labs  10/02/15 0445  WBC 10.0  HGB 10.0*  HCT 31.4*  PLT 253    Recent Labs  10/02/15 0445  NA 136  K 3.6  CL 97*  GLUCOSE 97  BUN 17  CREATININE 0.69  CALCIUM 8.6*   CBG (last 3)  No results for input(s): GLUCAP in the last 72 hours.  Wt Readings from Last 3 Encounters:  10/01/15 82.4 kg (181 lb 11.2 oz)  09/27/15 79.4 kg (175 lb 0.7 oz)    Physical Exam:  Constitutional: She is oriented to person, place, and time. She appears well-developed and well-nourished.  HENT:  Head: Normocephalic and atraumatic.  Mouth/Throat: Oropharynx is clear and moist.  Eyes: Conjunctivae are normal. Pupils are equal, round, and reactive to light. No scleral icterus.  Neck: Phonation normal. Immobilized by cervical collar--anterior incision with dry honeycomb dressing.   Cardiovascular: Normal rate and regular rhythm.   Respiratory: Effort normal and breath sounds normal. No stridor. No respiratory distress. She has no wheezes.  GI: Soft. Bowel sounds are normal. She exhibits no distension. There is no tenderness.  Musculoskeletal: She exhibits no edema.  Neurological: She is alert and oriented to person, place, and time. No cranial nerve deficit.  Speech  clear.  Able to follow basic commands without difficulty.  Decreased sensation  BUE.   Motor: RUE Shoulder abduction, elbow flexion, wrist extension 4+/5, elbow extension, hand grip 4/5 B/l UE Shoulder abduction, elbow flexion, wrist extension 4+/5, elbow extension, hand grip 4-/5 B/l LE: Hip flexion 4+/5, knee extension 4+/5, ankle dorsi/plantar flexion 4+/5 DTRs symmetric Skin: Skin is warm and dry. She is not diaphoretic. No erythema.  Psychiatric: She has a normal mood and affect. Her behavior is normal. Judgment and thought content normal  Assessment/Plan: 1. Functional and mobility deficits secondary to central cord injury which require 3+ hours per day of interdisciplinary therapy in a comprehensive inpatient rehab setting. Physiatrist is providing close team supervision and 24 hour management of active medical problems listed below. Physiatrist and rehab team continue to assess barriers to discharge/monitor patient progress toward functional and medical goals.  Function:  Bathing Bathing position      Bathing parts      Bathing assist        Upper Body Dressing/Undressing Upper body dressing                    Upper body assist        Lower Body Dressing/Undressing Lower body dressing                                  Lower body assist        Toileting Toileting   Toileting steps completed by patient: Adjust clothing prior to toileting, Adjust clothing after toileting Toileting steps completed by  helper: Performs perineal hygiene Toileting Assistive Devices: Grab bar or rail  Toileting assist Assist level: More than reasonable time   Transfers Chair/bed transfer   Chair/bed transfer method: Ambulatory Chair/bed transfer assist level: Touching or steadying assistance (Pt > 75%) Chair/bed transfer assistive device: Medical sales representative     Max distance: 200 Assist level: Touching or steadying assistance (Pt > 75%)   Wheelchair     Max wheelchair distance: 100 Assist Level: Supervision or verbal cues   Cognition Comprehension Comprehension assist level: Understands complex 90% of the time/cues 10% of the time  Expression Expression assist level: Expresses basic needs/ideas: With no assist  Social Interaction Social Interaction assist level: Interacts appropriately 90% of the time - Needs monitoring or encouragement for participation or interaction.  Problem Solving Problem solving assist level: Solves basic 90% of the time/requires cueing < 10% of the time  Memory Memory assist level: Recognizes or recalls 90% of the time/requires cueing < 10% of the time   Medical Problem List and Plan: 1.  Gait instability, poor coordination, decreased [place] secondary to Central cord syndrome.  -beginning therapies today. Likely short stay 2.  DVT Prophylaxis/Anticoagulation: SCD's. ambulating  3. Pain Management:  added low dose Neurontin to help with dysesthesias.   -Hydrocodone prn effective.  4. Mood: Stable and motivated. LCSW to follow for evaluation and support.  5. Neuropsych: This patient is capable of making decisions on her own behalf. 6. Skin/Wound Care: routine pressure relief measures.  Encourage po intake--family encouraged to bring supplements from home.  7. Fluids/Electrolytes/Nutrition:   -I personally reviewed the patient's labs today and all within normal limits other than slight increase in AST and ALT.  -encourage PO.  8. HTN:  Monitor BID--continue HCTZ/prinivil 9. Urinary retention/neurogenic bladder: toileting  patient every 4 hours. Monitor PVRs and cath for volumes > 350 cc.    -UA negative, UCX pending  10. Neurogenic bowel:   senna at bedtime.    LOS (Days) 1 A FACE TO FACE EVALUATION WAS PERFORMED  SWARTZ,ZACHARY T 10/02/2015 11:25 AM

## 2015-10-02 NOTE — Plan of Care (Signed)
Problem: RH PAIN MANAGEMENT Goal: RH STG PAIN MANAGED AT OR BELOW [name] PAIN GOAL Outcome: Not Progressing Reports pain as 7

## 2015-10-03 ENCOUNTER — Inpatient Hospital Stay (HOSPITAL_COMMUNITY): Payer: Self-pay | Admitting: Occupational Therapy

## 2015-10-03 ENCOUNTER — Inpatient Hospital Stay (HOSPITAL_COMMUNITY): Payer: Self-pay | Admitting: Physical Therapy

## 2015-10-03 ENCOUNTER — Inpatient Hospital Stay (HOSPITAL_COMMUNITY): Payer: Medicare Other | Admitting: Physical Therapy

## 2015-10-03 LAB — URINE CULTURE: CULTURE: NO GROWTH

## 2015-10-03 MED ORDER — MEGESTROL ACETATE 400 MG/10ML PO SUSP
400.0000 mg | Freq: Two times a day (BID) | ORAL | Status: DC
  Administered 2015-10-03 – 2015-10-08 (×11): 400 mg via ORAL
  Filled 2015-10-03 (×11): qty 10

## 2015-10-03 NOTE — Progress Notes (Signed)
[place] PHYSICAL MEDICINE & REHABILITATION     PROGRESS NOTE    Subjective/Complaints: Anxious that her hands aren't getting better sooner. Pain at times. Anxious about getting in the shower. Moved bowels this morning  ROS: Pt denies fever, rash/itching, headache, blurred or double vision, nausea, vomiting, abdominal pain, diarrhea, chest pain, shortness of breath, palpitations, dysuria, dizziness,   back pain, bleeding,  or depression   Objective: Vital Signs: Blood pressure 134/72, pulse 66, temperature 98 F (36.7 C), temperature source Oral, resp. rate 19, height 5\' 9"  (1.753 m), weight 82.4 kg (181 lb 11.2 oz), SpO2 96 %. No results found.  Recent Labs  10/02/15 0445  WBC 10.0  HGB 10.0*  HCT 31.4*  PLT 253    Recent Labs  10/02/15 0445  NA 136  K 3.6  CL 97*  GLUCOSE 97  BUN 17  CREATININE 0.69  CALCIUM 8.6*   CBG (last 3)  No results for input(s): GLUCAP in the last 72 hours.  Wt Readings from Last 3 Encounters:  10/01/15 82.4 kg (181 lb 11.2 oz)  09/27/15 79.4 kg (175 lb 0.7 oz)    Physical Exam:  Constitutional: She is oriented to person, place, and time. She appears well-developed and well-nourished.  HENT:  Head: Normocephalic and atraumatic.  Mouth/Throat: Oropharynx is clear and moist.  Eyes: Conjunctivae are normal. Pupils are equal, round, and reactive to light. No scleral icterus.  Neck: Phonation normal. Immobilized by cervical collar--anterior incision with dry honeycomb dressing.   Cardiovascular: Normal rate and regular rhythm.   Respiratory: Effort normal and breath sounds normal. No stridor. No respiratory distress. She has no wheezes.  GI: Soft. Bowel sounds are normal. She exhibits no distension. There is no tenderness.  Musculoskeletal: She exhibits no edema.  Neurological: She is alert and oriented to person, place, and time. No cranial nerve deficit.  Speech  clear.  Able to follow basic commands without difficulty.  Decreased  sensation BUE.   Motor: RUE Shoulder abduction, elbow flexion, wrist extension 4+/5, elbow extension, hand grip 4/5 B/l UE Shoulder abduction, elbow flexion, wrist extension 4+/5, elbow extension, hand grip 4-/5 B/l LE: Hip flexion 4+/5, knee extension 4+/5, ankle dorsi/plantar flexion 4+/5 DTRs symmetric Skin: elbow lacs healing, small red area on sacrum---no break--appears to be resolving.  Psychiatric: She has a normal mood and affect. Her behavior is normal. Judgment and thought content normal  Assessment/Plan: 1. Functional and mobility deficits secondary to central cord injury which require 3+ hours per day of interdisciplinary therapy in a comprehensive inpatient rehab setting. Physiatrist is providing close team supervision and 24 hour management of active medical problems listed below. Physiatrist and rehab team continue to assess barriers to discharge/monitor patient progress toward functional and medical goals.  Function:  Bathing Bathing position   Position: Wheelchair/chair at sink  Bathing parts Body parts bathed by patient: Right arm, Left arm, Chest, Abdomen, Front perineal area, Right upper leg, Left upper leg, Right lower leg, Left lower leg Body parts bathed by helper: Buttocks, Back  Bathing assist Assist Level: Touching or steadying assistance(Pt > 75%)      Upper Body Dressing/Undressing Upper body dressing   What is the patient wearing?: Orthosis, Pull over shirt/dress     Pull over shirt/dress - Perfomed by patient: Thread/unthread right sleeve, Thread/unthread left sleeve, Put head through opening, Pull shirt over trunk       Orthosis activity level: Performed by helper  Upper body assist Assist Level: Set up  Set up : To obtain clothing/put away  Lower Body Dressing/Undressing Lower body dressing   What is the patient wearing?: Underwear, Pants, Shoes Underwear - Performed by patient: Thread/unthread right underwear leg, Thread/unthread left underwear  leg, Pull underwear up/down   Pants- Performed by patient: Thread/unthread right pants leg, Thread/unthread left pants leg, Pull pants up/down           Shoes - Performed by patient: Don/doff right shoe, Don/doff left shoe            Lower body assist Assist for lower body dressing: Touching or steadying assistance (Pt > 75%)      Toileting Toileting   Toileting steps completed by patient: Adjust clothing prior to toileting, Adjust clothing after toileting, Performs perineal hygiene Toileting steps completed by helper: Performs perineal hygiene Toileting Assistive Devices: Grab bar or rail  Toileting assist Assist level: Supervision or verbal cues   Transfers Chair/bed transfer   Chair/bed transfer method: Ambulatory Chair/bed transfer assist level: Supervision or verbal cues Chair/bed transfer assistive device: Armrests     Locomotion Ambulation     Max distance: 150 Assist level: Supervision or verbal cues   Wheelchair     Max wheelchair distance: 100 Assist Level: Supervision or verbal cues  Cognition Comprehension Comprehension assist level: Understands complex 90% of the time/cues 10% of the time  Expression Expression assist level: Expresses basic needs/ideas: With no assist  Social Interaction Social Interaction assist level: Interacts appropriately 90% of the time - Needs monitoring or encouragement for participation or interaction.  Problem Solving Problem solving assist level: Solves basic 90% of the time/requires cueing < 10% of the time  Memory Memory assist level: Recognizes or recalls 90% of the time/requires cueing < 10% of the time   Medical Problem List and Plan: 1.  Gait instability, poor coordination, decreased [place] secondary to Central cord syndrome.  -continue inpatient therapies  -discussed recovery and prognosis with her today 2.  DVT Prophylaxis/Anticoagulation: SCD's. ambulating  3. Pain Management:  added low dose Neurontin to help with  dysesthesias.   -Hydrocodone prn effective.  4. Mood: Stable and motivated. LCSW to follow for evaluation and support.  5. Neuropsych: This patient is capable of making decisions on her own behalf. 6. Skin/Wound Care: routine pressure relief measures.  Encourage po intake--family encouraged to bring supplements from home.  7. Fluids/Electrolytes/Nutrition: .  -encourage PO  -add megace for appetite.  8. HTN:  Monitor BID--continue HCTZ/prinivil 9. Urinary retention/neurogenic bladder: toileting  patient every 4 hours.  -emptying bladder without issues currently  -UA negative, UCX negative also 10. Neurogenic bowel:   senna at bedtime. Had bm this morning   LOS (Days) 2 A FACE TO FACE EVALUATION WAS PERFORMED  Terri Bradley T 10/03/2015 12:49 PM

## 2015-10-03 NOTE — Progress Notes (Signed)
Physical Therapy Session Note  Patient Details  Name: Terri Bradley MRN: [id number] Date of Birth: [birth year]  Today's Date: 10/03/2015 PT Individual Time: [id number] PT Individual Time Calculation (min): 72 min    Short Term Goals: Week 1:  PT Short Term Goal 1 (Week 1): Pt will perform bed mobility with S PT Short Term Goal 2 (Week 1): Pt will perform stand pivot transfers with S PT Short Term Goal 3 (Week 1): Pt will ambulate 200' with S and LRAD PT Short Term Goal 4 (Week 1): Pt will ascend/descend 12 stairs with BUE and S  Skilled Therapeutic Interventions/Progress Updates:   Pt received seated in w/c, denies pain but does report BUEs "bothering me" and reports that "they're not better yet". Educated pt in gradual healing process following SCI, and that large gains are not expected to be made over night, and will continue healing and improving for several weeks/months. Also discussed that pt will likely go home before hands are working 100% of prior level of function due to improving safety, balance, but will continue therapy in home health/outpatient to continue progressing BUE coordination and strength. Pt ambulated in/out of bathroom with S. Performed toileting with modI. Retrieved dirty clothes from floor with close S for balance. Gait within room to retrieve dirty clothes, place in bag and put into bottom drawer, all with S. Gait to gym min guard/S x150'. Floor transfer performed x1 trial with modA for technique, sequencing, and d/t UE strength deficits. Gait on obstacle course weaving around cones, onto airex pad and 3" step, over weighted bars for dynamic balance and coordination; min guard overall with pt often reaching to hold onto therapist, however no major LOBs or unsteadiness noted. Pt reporting significant fatigue after these activities. Sit <>stand 2x10 reps no UEs, 1x20 heel raises with light UE support. Gait to return to room with close S. Bimanual task organizing items on tray  to prepare for lunch, S. Remained seated in w/c at end of session.   Therapy Documentation Precautions:  Precautions Precautions: Fall, Cervical Required Braces or Orthoses: Cervical Brace Cervical Brace: Hard collar, At all times Restrictions Weight Bearing Restrictions: No   See Function Navigator for Current Functional Status.   Therapy/Group: Individual Therapy  Luberta Mutter 10/03/2015, 11:54 AM

## 2015-10-03 NOTE — Progress Notes (Signed)
Occupational Therapy Session Note  Patient Details  Name: Terri Bradley MRN: [id number] Date of Birth: September 02, 1927  Today's Date: 10/03/2015 OT Individual Time: 0847-1000 and 1430-1500 OT Individual Time Calculation (min): 73 min and 30 min    Short Term Goals:Week 1:  OT Short Term Goal 1 (Week 1): Pt will complete toileting task with supervision OT Short Term Goal 2 (Week 1): Pt will complete kitchen mobility task with supervision using LRAD OT Short Term Goal 3 (Week 1): Pt will complete bathing task with supervision  Skilled Therapeutic Interventions/Progress Updates:    Session One: Pt seen for OT ADL bathing/dressing session. Pt on toilet upon arrival with NT assisting. She was hesitant regarding getting in shower due to fear, however, with encouragement willing to attempt. She bathed seated on tub bench with supervision, standing with use of grab abrs to complete buttock hygiene. Pt able to maintain functional grasp to washcloth throughout task. She dressed  Seated on toilet, requiring assist to pull up pants due to diminished grip strength/ sensation.    Sitting EOB, pt completed fine motor task, removing and replacing caps on small self care items. Completed at mod I level.  She ambulated to sink and completed oral care in standing with distant supervision. Pt then completed writing task in standing with emphasis on UE strengthening/ stability and fine motor coordination.  Pt left sitting in w/c at end of session, grandson present and all needs in reach. Discussed with pt home set-up and plans for bathing at home. Pt stated she prefers baths and thinks she will complete sponge bathing from seated level if unable to get in/out of tub at d/c. Will practice simulated tub transfer during rehab stay and make recommendations following simulation.   Session Two: Pt seen for OT session focusing on functional transfers. Pt in supine upon arrival, easily awoken and agreeable to tx session.  She ambulated throughout unit with CGA- supervision without AD. In ADL apartment, pt completed simulated shower stall and tub/shower transfers to shower chair. Following demonstration, pt required VCs for technique and occasional steadying assist. Pt attempted floor transfer with PT earlier today and following that did not want to attempt bathtub transfer though bathing in the tub would be her preferred method. She completed x2 sit <> stand from low soft surface couch, requiring min A to stand. She returned to room at end of session, left with all needs in reach. Educated throughout session regarding pt's abilities, DME, bathroom set-up and recommended safety equipment, and d/c planning.  Pt went to bathroom dueing session and blood appeared in toilet. Pt stated she has hx of hemorrhoids. RN made aware.   Therapy Documentation Precautions:  Precautions Precautions: Fall, Cervical Required Braces or Orthoses: Cervical Brace Cervical Brace: Hard collar, At all times Restrictions Weight Bearing Restrictions: No Pain:   No/ denies pain  See Function Navigator for Current Functional Status.   Therapy/Group: Individual Therapy  Lewis, Derriona Branscom C 10/03/2015, 7:12 AM

## 2015-10-03 NOTE — Care Management Note (Signed)
[place] Individual Statement of Services  Patient Name:  Terri Bradley  Date:  10/03/2015  Welcome to the [facility].  Our goal is to provide you with an individualized program based on your diagnosis and situation, designed to meet your specific needs.  With this comprehensive rehabilitation program, you will be expected to participate in at least 3 hours of rehabilitation therapies Monday-Friday, with modified therapy programming on the weekends.  Your rehabilitation program will include the following services:  Physical Therapy (PT), Occupational Therapy (OT), 24 hour per day rehabilitation nursing, Therapeutic Recreaction (TR), Case Management (Social Worker), Rehabilitation Medicine, Nutrition Services and Pharmacy Services  Weekly team conferences will be held on Tuesdays to discuss your progress.  Your Social Worker will talk with you frequently to get your input and to update you on team discussions.  Team conferences with you and your family in attendance may also be held.  Expected length of stay: 7-10 days  Overall anticipated outcome: modified independent  Depending on your progress and recovery, your program may change. Your Social Worker will coordinate services and will keep you informed of any changes. Your Social Worker's name and contact numbers are listed  below.  The following services may also be recommended but are not provided by the [facility] will be made to provide these services after discharge if needed.  Arrangements include referral to agencies that provide these services.  Your insurance has been verified to be:  Auto liability, [facility] Medicare Your primary doctor is:  Dr. Emily Filbert  Pertinent information will be shared with your doctor and your insurance company.  Social  Worker:  [place], [place] or [phone]   Information discussed with and copy given to patient by: [name], 10/03/2015, 11:07 AM

## 2015-10-03 NOTE — Progress Notes (Signed)
Physical Therapy Note  Patient Details  Name: Terri Bradley MRN: [id number] Date of Birth: July 15, 1927 Today's Date: 10/03/2015    Time: 1300-1355 55 minutes  1:1 No c/o pain, pt c/o fatigue from previous sessions.  Gait without AD throughout unit with supervision, no LOB in controlled environment.  Biodex for wt shifts using maze control and center of balance training. Pt with difficulty with forward wt shifts but improved ability to perform wt shifts with each attempt. standing on rocker board in parallel bars A/P and lateral wt shifts, pt min A with A/P, mod/max A with lateral wt shifts due to decreased hip strength. Tandem stance on foam without UE support with min A, progressing to close supervision.  Seated and standing LE therex for hip and knee strengthening.  Nu step x 8 minutes level 4 with encouragement to continue despite fatigue. Pt is self limiting due to fatigue but performs well during session.   Mitsuru Dault 10/03/2015, 1:42 PM

## 2015-10-03 NOTE — IPOC Note (Signed)
Overall Plan of Care [facility]) Patient Details Name: Terri Bradley MRN: [id number] DOB: 03/24/1927  Admitting Diagnosis: central cord  Hospital Problems: Active Problems:   Central cord syndrome at C6 level of cervical spinal cord [facility])   Neurologic gait disorder   Neuropathic pain   Post-operative pain   Neurogenic bladder   Neurogenic bowel     Functional Problem List: Nursing Bladder, Bowel, Edema, Endurance, Medication Management, Safety, Skin Integrity, Pain  PT Balance, Safety, Endurance, Motor  OT Balance, Endurance, Motor, Pain, Perception, Sensory, Safety  SLP    TR         Basic ADL's: OT Bathing, Grooming, Eating, Dressing, Toileting     Advanced  ADL's: OT Simple Meal Preparation, Laundry, Light Housekeeping     Transfers: PT Bed Mobility, Bed to Chair, Car, Floor, Manufacturing systems engineer, Metallurgist: PT Ambulation, Stairs     Additional Impairments: OT Fuctional Use of Upper Extremity  SLP        TR      Anticipated Outcomes Item Anticipated Outcome  Self Feeding Mod I  Swallowing      Basic self-care  Mod I  Toileting  Mod I   Bathroom Transfers Supervision- Mod I  Bowel/Bladder  patient will be continent of bowel and bladder with min assist  Transfers  modI  Locomotion  modI  Communication     Cognition     Pain  pain less than or equal to 4 on a scale of 0-10 with min assis  Safety/Judgment  Patient will be free from falls and displayihg sound safety judgement with min assis   Therapy Plan: PT Intensity: Minimum of 1-2 x/day ,45 to 90 minutes PT Frequency: 5 out of 7 days PT Duration Estimated Length of Stay: 7-10 days OT Intensity: Minimum of 1-2 x/day, 45 to 90 minutes OT Frequency: 5 out of 7 days OT Duration/Estimated Length of Stay: 7-10 days         Team Interventions: Nursing Interventions Patient/Family Education, Pain Management, Medication Management, Skin Care/Wound Management, Discharge Planning   PT interventions Functional mobility training, Neuromuscular re-education, Patient/family education, Stair training, Therapeutic Exercise, UE/LE Coordination activities, UE/LE Strength taining/ROM, Therapeutic Activities, Psychosocial support, Disease management/prevention, Academic librarian, Training and development officer, Ambulation/gait training, Engineer, drilling, Discharge planning  OT Interventions Training and development officer, Community reintegration, Discharge planning, DME/adaptive equipment instruction, Neuromuscular re-education, Functional mobility training, Pain management, Functional electrical stimulation, Patient/family education, Psychosocial support, Skin care/wound managment, Self Care/advanced ADL retraining, Therapeutic Activities, Therapeutic Exercise, UE/LE Strength taining/ROM, UE/LE Coordination activities  SLP Interventions    TR Interventions    SW/CM Interventions Discharge Planning, Psychosocial Support, Patient/Family Education    Team Discharge Planning: Destination: PT-Home ,OT- Home , SLP-  Projected Follow-up: PT-Home health PT, OT-  Home health OT, SLP-  Projected Equipment Needs: PT-To be determined, OT- To be determined, SLP-  Equipment Details: PT-has RW, OT-  Patient/family involved in discharge planning: PT- Patient,  OT-Patient, SLP-   MD ELOS: 7-9 days Medical Rehab Prognosis:  Excellent Assessment: The patient has been admitted for CIR therapies with the diagnosis of cervical central cord syndrome. The team will be addressing functional mobility, strength, stamina, balance, safety, adaptive techniques and equipment, self-care, bowel and bladder mgt, patient and caregiver education, NMR, pain mgt, coping skills, community reintegration. Goals have been set at mod I for mobility and self-care. No obvious barriers to discharge but patient still displays unrealistic expectations regarding time course of recovery.  Meredith Staggers, MD,  FAAPMR      See Team Conference Notes for weekly updates to the plan of care

## 2015-10-04 ENCOUNTER — Inpatient Hospital Stay (HOSPITAL_COMMUNITY): Payer: Self-pay | Admitting: Physical Therapy

## 2015-10-04 NOTE — Progress Notes (Signed)
[place] PHYSICAL MEDICINE & REHABILITATION     PROGRESS NOTE    Subjective/Complaints: No new issues this morning. Therapy went well yesterday  ROS: Pt denies fever, rash/itching, headache, blurred or double vision, nausea, vomiting, abdominal pain, diarrhea, chest pain, shortness of breath, palpitations, dysuria, dizziness,   back pain, bleeding,  or depression   Objective: Vital Signs: Blood pressure (!) 157/62, pulse 88, temperature 99 F (37.2 C), temperature source Oral, resp. rate 18, height 5\' 9"  (1.753 m), weight 82.4 kg (181 lb 11.2 oz), SpO2 94 %. No results found.  Recent Labs  10/02/15 0445  WBC 10.0  HGB 10.0*  HCT 31.4*  PLT 253    Recent Labs  10/02/15 0445  NA 136  K 3.6  CL 97*  GLUCOSE 97  BUN 17  CREATININE 0.69  CALCIUM 8.6*   CBG (last 3)  No results for input(s): GLUCAP in the last 72 hours.  Wt Readings from Last 3 Encounters:  10/01/15 82.4 kg (181 lb 11.2 oz)  09/27/15 79.4 kg (175 lb 0.7 oz)    Physical Exam:  Constitutional: She is oriented to person, place, and time. She appears well-developed and well-nourished.  HENT:  Head: Normocephalic and atraumatic.  Mouth/Throat: Oropharynx is clear and moist.  Eyes: Conjunctivae are normal. Pupils are equal, round, and reactive to light. No scleral icterus.  Neck: Phonation normal. Immobilized by cervical collar--anterior incision clean.   Cardiovascular: Normal rate and regular rhythm.   Respiratory: Effort normal and breath sounds normal. No stridor. No respiratory distress. She has no wheezes.  GI: Soft. Bowel sounds are normal. She exhibits no distension. There is no tenderness.  Musculoskeletal: She exhibits no edema.  Neurological: She is alert and oriented to person, place, and time. No cranial nerve deficit.  Speech  clear.  Able to follow basic commands without difficulty.  Decreased sensation BUE.   Motor: RUE Shoulder abduction, elbow flexion, wrist extension 4+/5, elbow  extension, hand grip 4/5 B/l UE Shoulder abduction, elbow flexion, wrist extension 4+/5, elbow extension, hand grip 4-/5 B/l LE: Hip flexion 4+/5, knee extension 4+/5, ankle dorsi/plantar flexion 4+/5 DTRs symmetric Skin: elbow lacs healing, small red area on sacrum---no break--appears to be resolving.  Psychiatric: She has a normal mood and affect. Her behavior is normal. Judgment and thought content normal  Assessment/Plan: 1. Functional and mobility deficits secondary to central cord injury which require 3+ hours per day of interdisciplinary therapy in a comprehensive inpatient rehab setting. Physiatrist is providing close team supervision and 24 hour management of active medical problems listed below. Physiatrist and rehab team continue to assess barriers to discharge/monitor patient progress toward functional and medical goals.  Function:  Bathing Bathing position   Position: Wheelchair/chair at sink  Bathing parts Body parts bathed by patient: Right arm, Left arm, Chest, Abdomen, Front perineal area, Right upper leg, Left upper leg, Right lower leg, Left lower leg Body parts bathed by helper: Buttocks, Back  Bathing assist Assist Level: Touching or steadying assistance(Pt > 75%)      Upper Body Dressing/Undressing Upper body dressing   What is the patient wearing?: Orthosis, Pull over shirt/dress     Pull over shirt/dress - Perfomed by patient: Thread/unthread right sleeve, Thread/unthread left sleeve, Put head through opening, Pull shirt over trunk       Orthosis activity level: Performed by helper  Upper body assist Assist Level: Set up   Set up : To obtain clothing/put away  Lower Body Dressing/Undressing Lower body dressing  What is the patient wearing?: Underwear, Pants, Shoes Underwear - Performed by patient: Thread/unthread right underwear leg, Thread/unthread left underwear leg, Pull underwear up/down   Pants- Performed by patient: Thread/unthread right pants  leg, Thread/unthread left pants leg, Pull pants up/down           Shoes - Performed by patient: Don/doff right shoe, Don/doff left shoe            Lower body assist Assist for lower body dressing: Touching or steadying assistance (Pt > 75%)      Toileting Toileting   Toileting steps completed by patient: Adjust clothing prior to toileting, Adjust clothing after toileting, Performs perineal hygiene Toileting steps completed by helper: Performs perineal hygiene Toileting Assistive Devices: Grab bar or rail  Toileting assist Assist level: Supervision or verbal cues   Transfers Chair/bed transfer   Chair/bed transfer method: Ambulatory Chair/bed transfer assist level: Supervision or verbal cues Chair/bed transfer assistive device: Armrests     Locomotion Ambulation     Max distance: 150 Assist level: Supervision or verbal cues   Wheelchair     Max wheelchair distance: 100 Assist Level: Supervision or verbal cues  Cognition Comprehension Comprehension assist level: Understands complex 90% of the time/cues 10% of the time  Expression Expression assist level: Expresses complex 90% of the time/cues < 10% of the time  Social Interaction Social Interaction assist level: Interacts appropriately with others - No medications needed.  Problem Solving Problem solving assist level: Solves complex 90% of the time/cues < 10% of the time  Memory Memory assist level: Recognizes or recalls 90% of the time/requires cueing < 10% of the time   Medical Problem List and Plan: 1.  Gait instability, poor coordination, decreased Maish Vaya secondary to Central cord syndrome.  -continue inpatient therapies  -  2.  DVT Prophylaxis/Anticoagulation: SCD's. ambulating  3. Pain Management:  added low dose Neurontin to help with dysesthesias.   -Hydrocodone prn effective.  4. Mood: Stable and motivated. LCSW to follow for evaluation and support.  5. Neuropsych: This patient is capable of making decisions  on her own behalf. 6. Skin/Wound Care: routine pressure relief measures.  Encourage po intake--family encouraged to bring supplements from home.  7. Fluids/Electrolytes/Nutrition: .  -encourage PO  -added megace for appetite. --observe for result 8. HTN:  Monitor BID--continue HCTZ/prinivil 9. Urinary retention/neurogenic bladder: toileting  patient every 4 hours.  -emptying bladder without issues currently  -UA negative, UCX negative also 10. Neurogenic bowel:   senna at bedtime.     LOS (Days) 3 A FACE TO FACE EVALUATION WAS PERFORMED  SWARTZ,ZACHARY T 10/04/2015 10:20 AM

## 2015-10-04 NOTE — Progress Notes (Signed)
Physical Therapy Session Note  Patient Details  Name: Terri Bradley MRN: [id number] Date of Birth: 1927/06/30  Today's Date: 10/04/2015 PT Individual Time: 1001-1045 PT Individual Time Calculation (min): 44 min    Short Term Goals: Week 1:  PT Short Term Goal 1 (Week 1): Pt will perform bed mobility with S PT Short Term Goal 2 (Week 1): Pt will perform stand pivot transfers with S PT Short Term Goal 3 (Week 1): Pt will ambulate 200' with S and LRAD PT Short Term Goal 4 (Week 1): Pt will ascend/descend 12 stairs with BUE and S  Skilled Therapeutic Interventions/Progress Updates:     Patient received supine in bed and agreeable to PT. Supine>sit through log roll with increase time and effort but no assist from PT.   Patient instructed in upper body and lower body dressing sitting EOB with supervision A from PT for increased safety with reaching to feet. Patient performed sit<>stand with supervision A to don pants to waist.   Gait in hall with RW, 176f x 2 and supervision A from PT for improved safety. PT provided min cues for improved awareness of LE positioning to prevent scissorrng.   Standing balance training on airex:  Normal BOS eyes open 2x 2 minutes / eyes closed 3 x 30 seconds.  Narrow BOS 2x 1 minute  Lateral reaches L and R with BUE and normal BOS.  Throughout balance training PT provided supervision A and occasional min A to prevent posterior LOB as well as min cues for improved use of ankle strategy to correct lateral and posterior LOB.   Patient returned to room and left sitting in recliner with call bell in reach and all needs met.   Therapy Documentation Precautions:  Precautions Precautions: Fall, Cervical Required Braces or Orthoses: Cervical Brace Cervical Brace: Hard collar, At all times Restrictions Weight Bearing Restrictions: No  See Function Navigator for Current Functional Status.   Therapy/Group: Individual Therapy  ALorie Phenix9/03/2015,  3:51 PM

## 2015-10-05 ENCOUNTER — Inpatient Hospital Stay (HOSPITAL_COMMUNITY): Payer: Medicare Other | Admitting: Occupational Therapy

## 2015-10-05 ENCOUNTER — Inpatient Hospital Stay (HOSPITAL_COMMUNITY): Payer: Medicare Other | Admitting: Physical Therapy

## 2015-10-05 NOTE — Progress Notes (Signed)
Physical Therapy Session Note  Patient Details  Name: BIANCIA GEDEON MRN: [id number] Date of Birth: [birth year]  Today's Date: 10/05/2015 PT Concurrent Time: 1347-1502 PT Concurrent Time Calculation (min): 75 min   Short Term Goals: Week 1:  PT Short Term Goal 1 (Week 1): Pt will perform bed mobility with S PT Short Term Goal 2 (Week 1): Pt will perform stand pivot transfers with S PT Short Term Goal 3 (Week 1): Pt will ambulate 200' with S and LRAD PT Short Term Goal 4 (Week 1): Pt will ascend/descend 12 stairs with BUE and S  Skilled Therapeutic Interventions/Progress Updates:    Pt received in gym & agreeable to treatment, denying c/o pain. Pt utilized nu-step on level 4 x 12 minutes for cardiovascular endurance training with cuing to pace herself during task; pt noted 12 on Borg RPE scale. Pt played connect 4 game while standing with supervision with focus of activity being on fine motor control of small objects with BUE. Pt then sorted through putty (very soft putty, progressing to medium putty) with BUE to obtain beads for fine motor control and use of BUE. Pt ambulated throughout unit (main gym<>ortho gym) without AD & close supervision for balance. Pt performed car transfer from small SUV simulated height with supervision. [name] daughter & son-in-law present for session & reports she has a single step to enter home and held onto doors for balance prior to admission. Pt practiced single step negotiation without UE support in ortho gym multiple times with education regarding purpose of task; educated pt it may not be safe to hold onto doors if they move easily. Pt attempted to use rail for support and when not using rail required min assist from therapist for balance during task. [name] family reports they will take a picture of home entry to provide to therapist this week. Pt performed sit<>stand transfers (5 reps x 3 sets) from elevated mat table progressing to lower height without BUE support  for LE strengthening. Therapist provided cuing for pt to scoot out to edge of mat to decrease her reliance on mat table by pushing with BLE. Pt ambulated back to room without AD & supervision; pt left sitting in recliner with all needs within reach & family present to supervise.   Therapy Documentation Precautions:  Precautions Precautions: Fall, Cervical Required Braces or Orthoses: Cervical Brace Cervical Brace: Hard collar, At all times Restrictions Weight Bearing Restrictions: No   See Function Navigator for Current Functional Status.   Therapy/Group: Concurrent treatment  Waunita Schooner 10/05/2015, 5:06 PM

## 2015-10-05 NOTE — Progress Notes (Addendum)
Occupational Therapy Session Note  Patient Details  Name: Terri Bradley MRN: [id number] Date of Birth: Aug 14, 1927  Today's Date: 10/05/2015 OT Individual Time: [id number] and 1100-1200 OT Individual Time Calculation (min): 57 min and 60 min    Short Term Goals:Week 1:  OT Short Term Goal 1 (Week 1): Pt will complete toileting task with supervision OT Short Term Goal 2 (Week 1): Pt will complete kitchen mobility task with supervision using LRAD OT Short Term Goal 3 (Week 1): Pt will complete bathing task with supervision  Skilled Therapeutic Interventions/Progress Updates:    Session One: Pt seen for OT ADL bathing/dressing session. Pt in supine upon arrival, agreeable to tx session. She ambulated with supervision without AD to gather clothing items. She bathed and dressed seated in chair at sink with set-up assist. Pt demonstrated ability to manipulate medium sized buttons to dress and small self care containers for grooming tasks.  She ambulated throughout unit to therapy day room where she completed coupon cutting activity with emphasis on intrinsic hand strengthening and incorportation of B UEs into functional task. Completed mod I.  Pt practiced buttoning small buttons on men's button down shirt. Shirt placed at overhead/ chest level to require pt to reach up to complete task to address UE strengthening. Completed x2 trials of buttoning/ unbuttoning with seated rest break provided btwn trials. Pt demonstrated functional fine motor abilities to manipulate small buttons with slightly increased time.  She completed clothes pin tree in seated position, reaching overhead to place pins and bending low to retrieve pins from low surface- dynamic balance with supervision. Pt able to manipulate medium grade (greeen and red) with pincer grasp, however, for higher resistance (blue and black) pt had to use entire hand along with tenodesis to manipulate clothes pin.  Pt returned to room at end of session,  left sitting in recliner with all needs in reach.   Session Two: Pt seen for OT session focusing on UE strengthening and neuro re- ed. Pt sitting up in recliner upon arrival, agreeable to tx session. Ambulated throughout session without AD and supervision. Completed theraband exercises using medium grade (orange) theraband. Demonstration and handout of exercises provided with pt required verbal and tactile cuing for proper form and technique. Pt would benefit from cont skilled tx with HEP as pt with difficulty following written/picture directions of exercises. Completed chest expansion, diagonal up, diagonal down and bicep curls. x2 sets of 10 reps with rest breaks throughout. Handout and demonstration also provided for theraputty exercises and completed 8 exercises as part of HEP. Pt completed 9 hole peg test- see results below. [name] family arrived at end of session. Discussed OT/PT goals, continuum of care, current level of assist, and d/c planning.  Pt returned to room at end of session, left sitting in recliner with all needs in reach and family members present.   9 Hole Peg Test:  R: 27.90 sec, 28.35 sec, and 25.52 sec AVG: 27.26 L: 35.99 sec, 29.32 sec, and 30.72 sec  AVG: 32.01   Therapy Documentation Precautions:  Precautions Precautions: Fall, Cervical Required Braces or Orthoses: Cervical Brace Cervical Brace: Hard collar, At all times Restrictions Weight Bearing Restrictions: No Pain:   No/ denies pain  See Function Navigator for Current Functional Status.   Therapy/Group: Individual Therapy  Lewis, Pierina Schuknecht C 10/05/2015, 6:46 AM

## 2015-10-05 NOTE — Progress Notes (Signed)
[place] PHYSICAL MEDICINE & REHABILITATION     PROGRESS NOTE    Subjective/Complaints: No new complaints. Doing well. Eating better  ROS: Pt denies fever, rash/itching, headache, blurred or double vision, nausea, vomiting, abdominal pain, diarrhea, chest pain, shortness of breath, palpitations, dysuria, dizziness,   back pain, bleeding,  or depression   Objective: Vital Signs: Blood pressure (!) 143/53, pulse 68, temperature 98.2 F (36.8 C), temperature source Oral, resp. rate 18, height 5\' 9"  (1.753 m), weight 82.4 kg (181 lb 11.2 oz), SpO2 95 %. No results found. No results for input(s): WBC, HGB, HCT, PLT in the last 72 hours. No results for input(s): NA, K, CL, GLUCOSE, BUN, CREATININE, CALCIUM in the last 72 hours.  Invalid input(s): CO CBG (last 3)  No results for input(s): GLUCAP in the last 72 hours.  Wt Readings from Last 3 Encounters:  10/01/15 82.4 kg (181 lb 11.2 oz)  09/27/15 79.4 kg (175 lb 0.7 oz)    Physical Exam:  Constitutional: She is oriented to person, place, and time. She appears well-developed and well-nourished.  HENT:  Head: Normocephalic and atraumatic.  Mouth/Throat: Oropharynx is clear and moist.  Eyes: Conjunctivae are normal. Pupils are equal, round, and reactive to light. No scleral icterus.  Neck: Phonation normal. Immobilized by cervical collar--anterior incision clean.   Cardiovascular: Normal rate and regular rhythm.   Respiratory: Effort normal and breath sounds normal. No stridor. No respiratory distress. She has no wheezes.  GI: Soft. Bowel sounds are normal. She exhibits no distension. There is no tenderness.  Musculoskeletal: She exhibits no edema.  Neurological: She is alert and oriented to person, place, and time. No cranial nerve deficit.  Speech  clear.  Able to follow basic commands without difficulty.  Decreased sensation BUE.   Motor: RUE Shoulder abduction, elbow flexion, wrist extension 4+/5, elbow extension, hand grip  4/5 B/l UE Shoulder abduction, elbow flexion, wrist extension 4+/5, elbow extension, hand grip 4-/5 B/l LE: Hip flexion 4+/5, knee extension 4+/5, ankle dorsi/plantar flexion 4+/5 DTRs symmetric Skin: elbow lacs healing, small red area on sacrum---no break--appears to be resolving.  Psychiatric: She has a normal mood and affect. Her behavior is normal. Judgment and thought content normal  Assessment/Plan: 1. Functional and mobility deficits secondary to central cord injury which require 3+ hours per day of interdisciplinary therapy in a comprehensive inpatient rehab setting. Physiatrist is providing close team supervision and 24 hour management of active medical problems listed below. Physiatrist and rehab team continue to assess barriers to discharge/monitor patient progress toward functional and medical goals.  Function:  Bathing Bathing position   Position: Wheelchair/chair at sink  Bathing parts Body parts bathed by patient: Right arm, Left arm, Chest, Abdomen, Front perineal area, Right upper leg, Left upper leg, Right lower leg, Left lower leg, Buttocks, Back Body parts bathed by helper: Buttocks, Back  Bathing assist Assist Level: Supervision or verbal cues, Set up   Set up : To obtain items  Upper Body Dressing/Undressing Upper body dressing   What is the patient wearing?: Orthosis, Button up shirt     Pull over shirt/dress - Perfomed by patient: Thread/unthread right sleeve, Thread/unthread left sleeve, Put head through opening, Pull shirt over trunk   Button up shirt - Perfomed by patient: Thread/unthread right sleeve, Thread/unthread left sleeve, Pull shirt around back, Button/unbutton shirt   Orthosis activity level: Performed by helper  Upper body assist Assist Level: Set up   Set up : To apply TLSO, cervical collar  Lower Body Dressing/Undressing Lower body dressing   What is the patient wearing?: Underwear, Pants, Shoes Underwear - Performed by patient:  Thread/unthread right underwear leg, Thread/unthread left underwear leg, Pull underwear up/down   Pants- Performed by patient: Thread/unthread right pants leg, Thread/unthread left pants leg, Pull pants up/down           Shoes - Performed by patient: Don/doff right shoe, Don/doff left shoe            Lower body assist Assist for lower body dressing: Supervision or verbal cues      Toileting Toileting   Toileting steps completed by patient: Adjust clothing prior to toileting, Adjust clothing after toileting, Performs perineal hygiene Toileting steps completed by helper: Performs perineal hygiene Toileting Assistive Devices: Grab bar or rail  Toileting assist Assist level: Supervision or verbal cues   Transfers Chair/bed transfer   Chair/bed transfer method: Ambulatory Chair/bed transfer assist level: Supervision or verbal cues Chair/bed transfer assistive device: Medical sales representative     Max distance: 125ft  Assist level: Supervision or verbal cues   Wheelchair     Max wheelchair distance: 100 Assist Level: Supervision or verbal cues  Cognition Comprehension Comprehension assist level: Understands complex 90% of the time/cues 10% of the time  Expression Expression assist level: Expresses complex 90% of the time/cues < 10% of the time  Social Interaction Social Interaction assist level: Interacts appropriately with others - No medications needed.  Problem Solving Problem solving assist level: Solves complex 90% of the time/cues < 10% of the time  Memory Memory assist level: Recognizes or recalls 90% of the time/requires cueing < 10% of the time   Medical Problem List and Plan: 1.  Gait instability, poor coordination, decreased [place] secondary to Central cord syndrome.  -continue inpatient therapies  -  2.  DVT Prophylaxis/Anticoagulation: SCD's. ambulating  3. Pain Management:  added low dose Neurontin to help with dysesthesias.   -Hydrocodone prn  effective.  4. Mood: Stable and motivated. LCSW to follow for evaluation and support.  5. Neuropsych: This patient is capable of making decisions on her own behalf. 6. Skin/Wound Care: routine pressure relief measures.  Encourage po intake--family encouraged to bring supplements from home.  7. Fluids/Electrolytes/Nutrition: .  -encourage PO  -added megace for appetite with good results. 8. HTN:  Monitor BID--continue HCTZ/prinivil 9. Urinary retention/neurogenic bladder: toileting  patient every 4 hours.  -emptying bladder without issues currently  -UA negative, UCX negative also 10. Neurogenic bowel:   senna at bedtime.     LOS (Days) 4 A FACE TO FACE EVALUATION WAS PERFORMED  SWARTZ,ZACHARY T 10/05/2015 11:20 AM

## 2015-10-06 ENCOUNTER — Inpatient Hospital Stay (HOSPITAL_COMMUNITY): Payer: Self-pay | Admitting: Physical Therapy

## 2015-10-06 ENCOUNTER — Inpatient Hospital Stay (HOSPITAL_COMMUNITY): Payer: Medicare Other | Admitting: Occupational Therapy

## 2015-10-06 ENCOUNTER — Inpatient Hospital Stay (HOSPITAL_COMMUNITY): Payer: Medicare Other

## 2015-10-06 NOTE — Progress Notes (Signed)
Occupational Therapy Session Note  Patient Details  Name: Terri Bradley MRN: [id number] Date of Birth: 04-Mar-1927  Today's Date: 10/06/2015 OT Individual Time: 0935-1030 and 1400-1500 OT Individual Time Calculation (min): 55 min and 60 min    Short Term Goals:Week 1:  OT Short Term Goal 1 (Week 1): Pt will complete toileting task with supervision OT Short Term Goal 2 (Week 1): Pt will complete kitchen mobility task with supervision using LRAD OT Short Term Goal 3 (Week 1): Pt will complete bathing task with supervision  Skilled Therapeutic Interventions/Progress Updates:    Session One:  Pt seen for OT session focusing on fine motor coordination, UE strengthening and IADL re-training.Pt received in therapy gym with hand off from other OT. Pt working on hand witting and talking about [place] placed on pen. Discussed other uses for [place] for other ADL items.  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 Educated and demonstrated to pt how to access patient computer on unit. Pt worked on typing with B UEs and managing computer mouse, able to complete at mod I level. Pt motivated to return to computer at later time to cont to practice. In ADL apartment, pt swept floor, bending over to pick up dust pan. She then made bed, min VCs for safety awareness of stepping over items on floor. She then watered plants, retrieving and filling watering can and watering plants, using B UEs at functional level independently. In kitchen, gathered items in prep for cooking task she would like to do during this afternoon's session. Throughout session ambulating without AD with distant supervision. 1-2 LOB episodes which pt is able to self correct. [name] family in room upon return at end of session. Family with questions regarding d/c planning, pt's current level of function, and DME. All questions answered. Pt left sitting in recliner at end of session, all needs in reach with caregivers present.    Session Two: Pt seen for OT session focusing on meal prep goal. Pt sitting up in recliner upon arrival, voicing increased fatigue from previous session, however, agreeable to tx session. [name] daughter present and attended therapy session.   In ADL apartment, pt completed baking activity making cookies. She ambulated throughout kitchen to obtain items from overhead cabinets and low surfaces with supervision- mod I level. She required seated rest breaks throughout task and completed several steps of cooking task in seated position. Educated throughout regarding energy conservation and ways to ease cooking/ meal prep tasks.  Pt returned to room at end of session, left sitting in recliner with all needs in reach and daughter present.  She required VCs throughout for safety awareness in kitchen and educated regarding decreased sensation and functional implications.   Therapy Documentation Precautions:  Precautions Precautions: Fall, Cervical Required Braces or Orthoses: Cervical Brace Cervical Brace: Hard collar, At all times Restrictions Weight Bearing Restrictions: No Pain:   No/ denies pain  See Function Navigator for Current Functional Status.   Therapy/Group: Individual Therapy  Lewis, Aerika Groll C 10/06/2015, 7:12 AM

## 2015-10-06 NOTE — Progress Notes (Signed)
Physical Therapy Make-up Session Note  Patient Details  Name: Terri Bradley MRN: [id number] Date of Birth: 1927-07-01  Today's Date: 10/06/2015 PT Individual Time: 1302-1332 PT Individual Time Calculation (min): 30 min    Short Term Goals: Week 1:  PT Short Term Goal 1 (Week 1): Pt will perform bed mobility with S PT Short Term Goal 2 (Week 1): Pt will perform stand pivot transfers with S PT Short Term Goal 3 (Week 1): Pt will ambulate 200' with S and LRAD PT Short Term Goal 4 (Week 1): Pt will ascend/descend 12 stairs with BUE and S  Skilled Therapeutic Interventions/Progress Updates:    Session focused on gait in community setting and for distance/endurance without AD. Pt at overall supervision level for safety with gait with staggering noted but pt able to maintain balance without physical assist. D/c planning discussed during rest breaks.    Therapy Documentation Precautions:  Precautions Precautions: Fall, Cervical Required Braces or Orthoses: Cervical Brace Cervical Brace: Hard collar, At all times Restrictions Weight Bearing Restrictions: No   Pain:  No complaints of pain.  See Function Navigator for Current Functional Status.   Therapy/Group: Individual Therapy  Canary Brim Ivory Broad, PT, DPT  10/06/2015, 1:39 PM

## 2015-10-06 NOTE — Progress Notes (Signed)
[place] PHYSICAL MEDICINE & REHABILITATION     PROGRESS NOTE    Subjective/Complaints: Up with therapy. No new issues today  ROS: Pt denies fever, rash/itching, headache, blurred or double vision, nausea, vomiting, abdominal pain, diarrhea, chest pain, shortness of breath, palpitations, dysuria, dizziness,   back pain, bleeding,  or depression   Objective: Vital Signs: Blood pressure (!) 121/44, pulse 73, temperature 98 F (36.7 C), temperature source Oral, resp. rate 18, height 5\' 9"  (1.753 m), weight 82.4 kg (181 lb 11.2 oz), SpO2 98 %. No results found. No results for input(s): WBC, HGB, HCT, PLT in the last 72 hours. No results for input(s): NA, K, CL, GLUCOSE, BUN, CREATININE, CALCIUM in the last 72 hours.  Invalid input(s): CO CBG (last 3)  No results for input(s): GLUCAP in the last 72 hours.  Wt Readings from Last 3 Encounters:  10/01/15 82.4 kg (181 lb 11.2 oz)  09/27/15 79.4 kg (175 lb 0.7 oz)    Physical Exam:  Constitutional: She is oriented to person, place, and time. She appears well-developed and well-nourished.  HENT:  Head: Normocephalic and atraumatic.  Mouth/Throat: Oropharynx is clear and moist.  Eyes: Conjunctivae are normal. Pupils are equal, round, and reactive to light. No scleral icterus.  Neck: c-collar Cardiovascular: Normal rate and regular rhythm.   Respiratory: Effort normal and breath sounds normal. No stridor. No respiratory distress. She has no wheezes.  GI: Soft. Bowel sounds are normal. She exhibits no distension. There is no tenderness.  Musculoskeletal: She exhibits no edema.  Neurological: She is alert and oriented to person, place, and time. No cranial nerve deficit.  Speech  clear.  Able to follow basic commands without difficulty.  Decreased sensation BUE.   Motor: RUE Shoulder abduction, elbow flexion, wrist extension 4+/5, elbow extension, hand grip 4/5 B/l UE Shoulder abduction, elbow flexion, wrist extension 4+/5, elbow  extension, hand grip 4-/5 B/l LE: Hip flexion 4+/5, knee extension 4+/5, ankle dorsi/plantar flexion 4+/5 DTRs symmetric Skin: elbow lacs healing, small red area on sacrum---no break--appears to be resolving.  Psychiatric: She has a normal mood and affect. Her behavior is normal. Judgment and thought content normal  Assessment/Plan: 1. Functional and mobility deficits secondary to central cord injury which require 3+ hours per day of interdisciplinary therapy in a comprehensive inpatient rehab setting. Physiatrist is providing close team supervision and 24 hour management of active medical problems listed below. Physiatrist and rehab team continue to assess barriers to discharge/monitor patient progress toward functional and medical goals.  Function:  Bathing Bathing position   Position: Wheelchair/chair at sink  Bathing parts Body parts bathed by patient: Right arm, Left arm, Chest, Abdomen, Front perineal area, Right upper leg, Left upper leg, Right lower leg, Left lower leg, Buttocks, Back Body parts bathed by helper: Buttocks, Back  Bathing assist Assist Level: Supervision or verbal cues, Set up   Set up : To obtain items  Upper Body Dressing/Undressing Upper body dressing   What is the patient wearing?: Orthosis, Button up shirt     Pull over shirt/dress - Perfomed by patient: Thread/unthread right sleeve, Thread/unthread left sleeve, Put head through opening, Pull shirt over trunk   Button up shirt - Perfomed by patient: Thread/unthread right sleeve, Thread/unthread left sleeve, Pull shirt around back, Button/unbutton shirt   Orthosis activity level: Performed by helper  Upper body assist Assist Level: Set up   Set up : To apply TLSO, cervical collar  Lower Body Dressing/Undressing Lower body dressing   What  is the patient wearing?: Underwear, Pants, Shoes Underwear - Performed by patient: Thread/unthread right underwear leg, Thread/unthread left underwear leg, Pull  underwear up/down   Pants- Performed by patient: Thread/unthread right pants leg, Thread/unthread left pants leg, Pull pants up/down           Shoes - Performed by patient: Don/doff right shoe, Don/doff left shoe            Lower body assist Assist for lower body dressing: Supervision or verbal cues      Toileting Toileting   Toileting steps completed by patient: Adjust clothing prior to toileting, Performs perineal hygiene, Adjust clothing after toileting Toileting steps completed by helper: Performs perineal hygiene Toileting Assistive Devices: Grab bar or rail  Toileting assist Assist level: Touching or steadying assistance (Pt.75%)   Transfers Chair/bed transfer   Chair/bed transfer method: Ambulatory Chair/bed transfer assist level: Supervision or verbal cues Chair/bed transfer assistive device: Medical sales representative     Max distance: 150 ft Assist level: Supervision or verbal cues   Wheelchair     Max wheelchair distance: 100 Assist Level: Supervision or verbal cues  Cognition Comprehension Comprehension assist level: Follows basic conversation/direction with no assist  Expression Expression assist level: Expresses basic needs/ideas: With no assist  Social Interaction Social Interaction assist level: Interacts appropriately 90% of the time - Needs monitoring or encouragement for participation or interaction.  Problem Solving Problem solving assist level: Solves complex 90% of the time/cues < 10% of the time  Memory Memory assist level: Recognizes or recalls 90% of the time/requires cueing < 10% of the time   Medical Problem List and Plan: 1.  Gait instability, poor coordination, decreased [place] secondary to Central cord syndrome.  -continue inpatient therapies  -provide positive reinforcement as needed 2.  DVT Prophylaxis/Anticoagulation: SCD's. ambulating  3. Pain Management:  added low dose Neurontin to help with dysesthesias.   -Hydrocodone prn  effective.  4. Mood: Stable and motivated. LCSW to follow for evaluation and support.  5. Neuropsych: This patient is capable of making decisions on her own behalf. 6. Skin/Wound Care: routine pressure relief measures.  Encourage po intake--family encouraged to bring supplements from home.  7. Fluids/Electrolytes/Nutrition: .  -encourage PO  -continue megace for appetite  8. HTN:  Monitor BID--continue HCTZ/prinivil 9. Urinary retention/neurogenic bladder: toileting  patient every 4 hours.  -emptying bladder without issues currently  -UA negative, UCX negative also 10. Neurogenic bowel:   senna at bedtime.     LOS (Days) 5 A FACE TO FACE EVALUATION WAS PERFORMED  Terri Bradley T 10/06/2015 9:49 AM

## 2015-10-06 NOTE — Progress Notes (Signed)
Occupational Therapy Session Note  Patient Details  Name: Terri Bradley MRN: [id number] Date of Birth: May 19, 1927  Today's Date: 10/06/2015 OT Individual Time: [id number] OT Individual Time Calculation (min): 60 min     Short Term Goals:Week 1:  OT Short Term Goal 1 (Week 1): Pt will complete toileting task with supervision OT Short Term Goal 2 (Week 1): Pt will complete kitchen mobility task with supervision using LRAD OT Short Term Goal 3 (Week 1): Pt will complete bathing task with supervision  Skilled Therapeutic Interventions/Progress Updates:    Pt seen for skilled OT to facilitate functional mobility, balance, UE coordination/strength with ADL retraining. Pt declined shower, but did complete sponge bath at sink after completing toileting.  Pt at first stated she couldn't cleanse herself, but was able to with a few cues.  She also needed cues to wash hands after cleansing and to use soap. Otherwise, pt was close S with all self care. She ambulated to gym with close S without AD. Pt concerned about her ability to write. Adapted a pencil with a larger grip and pt worked on Theatre stage manager, simple sentence and her name in print and signed.  Her [place] improved with built up pencil. Pt worked on coordination ex of touching thumb to each finger.  Hand off to next therapist.    Therapy Documentation Precautions:  Precautions Precautions: Fall, Cervical Required Braces or Orthoses: Cervical Brace Cervical Brace: Hard collar, At all times Restrictions Weight Bearing Restrictions: No    Vital Signs: Therapy Vitals Temp: 98 F (36.7 C) Temp Source: Oral Pulse Rate: 73 Resp: 18 BP: (!) 121/44 Patient Position (if appropriate): Sitting Oxygen Therapy SpO2: 98 % O2 Device: Not Delivered   Pain: no c/o pain   ADL:  See Function Navigator for Current Functional Status.   Therapy/Group: Individual Therapy  [name] 10/06/2015, 8:22 AM

## 2015-10-06 NOTE — Progress Notes (Signed)
Physical Therapy Session Note  Patient Details  Name: Terri Bradley MRN: [id number] Date of Birth: 1927-02-20  Today's Date: 10/06/2015 PT Individual Time: 1100-1200 PT Individual Time Calculation (min): 60 min    Short Term Goals: Week 1:  PT Short Term Goal 1 (Week 1): Pt will perform bed mobility with S PT Short Term Goal 2 (Week 1): Pt will perform stand pivot transfers with S PT Short Term Goal 3 (Week 1): Pt will ambulate 200' with S and LRAD PT Short Term Goal 4 (Week 1): Pt will ascend/descend 12 stairs with BUE and S  Skilled Therapeutic Interventions/Progress Updates:    Pt received seated in w/c, denies pain and agreeable to treatment. Gait to/from gym and throughout session on level surface with S overall; 3x100-150' per trial. Educated pt's daughter and son-in-law on rehab process, pt's current functional level and limitations, and improving balance. [name] daughter reports that gait/balance appear to be close to baseline. Pt performed step up/down 4" step setup to simulate home entry with increased depth; min guard initially faded to close S without UE support. Performed car transfer, ramp, gait over unlevel surface with S overall. Biodex catch game on static and dynamic platform for ankle strategy, righting reactions, coordination. Standing balance on airex pad while utilizing BUEs for bead sorting and threading for [facility], attention and problem solving. Returned to room with gait and S as before, however used quad cane d/t pt request to use AD for comfort. No noted improvement in gait quality or safety, and pt noted to have poor management of cane that would render cane inefficient if actually needed to use for balance recovery. Discussed this with pt and family, all agreeable that cane is not warranted at this time. Remained seated in recliner at end of session, all needs in reach.   Therapy Documentation Precautions:  Precautions Precautions: Fall, Cervical Required Braces or  Orthoses: Cervical Brace Cervical Brace: Hard collar, At all times Restrictions Weight Bearing Restrictions: No   See Function Navigator for Current Functional Status.   Therapy/Group: Individual Therapy  Luberta Mutter 10/06/2015, 12:19 PM

## 2015-10-07 ENCOUNTER — Inpatient Hospital Stay (HOSPITAL_COMMUNITY): Payer: Medicare Other | Admitting: Occupational Therapy

## 2015-10-07 ENCOUNTER — Inpatient Hospital Stay (HOSPITAL_COMMUNITY): Payer: Medicare Other | Admitting: Physical Therapy

## 2015-10-07 ENCOUNTER — Inpatient Hospital Stay (HOSPITAL_COMMUNITY): Payer: Self-pay | Admitting: Physical Therapy

## 2015-10-07 ENCOUNTER — Inpatient Hospital Stay (HOSPITAL_COMMUNITY): Payer: Self-pay

## 2015-10-07 NOTE — Progress Notes (Signed)
Physical Therapy Note  Patient Details  Name: MEAGEN VATH MRN: [id number] Date of Birth: 1927-05-22 Today's Date: 10/07/2015    Time: 1400-1455 55 minutes  1:1 No c/o pain. Gait throughout unit with distant supervision. 6 minute walk test at 241 meters which is within normal range.  Stair negotiation with 2 handrails x 12 stairs with supervision.  Nu step for improving endurance x 5 minutes level 4.  Standing while threading beads for UE coordination and standing tolerance x 5 minutes.  Seated card game to improve fine motor coordination with pt able to deal cards and hold cards in hand, still difficulty with shuffling cards.  Picking up objects off of floor with reacher with supervision. Pt pleased with progress.   Chantz Montefusco 10/07/2015, 3:05 PM

## 2015-10-07 NOTE — Progress Notes (Signed)
Physical Therapy Session Note  Patient Details  Name: Terri Bradley MRN: [id number] Date of Birth: 01/04/1928  Today's Date: 10/07/2015 PT Individual Time: 0900-1000 PT Individual Time Calculation (min): 60 min    Short Term Goals: Week 1:  PT Short Term Goal 1 (Week 1): Pt will perform bed mobility with S PT Short Term Goal 2 (Week 1): Pt will perform stand pivot transfers with S PT Short Term Goal 3 (Week 1): Pt will ambulate 200' with S and LRAD PT Short Term Goal 4 (Week 1): Pt will ascend/descend 12 stairs with BUE and S  Skilled Therapeutic Interventions/Progress Updates:   Pt received seated in recliner, denies pain but does report fatigue after several morning sessions, however agreeable to treatment. Gait during session 3x>300' with modI, no LOB or unsteadiness noted. Performed progressive standing balance activities in parallel bars with normal BOS, narrow BOS, tandem stance, eyes open/closed conditions and on stable/foam surface. Sit <>stand x10 reps from low bench with mod verbal cues for technique for increase in anterior weight shift to A with standing and reduce reliance on UEs. Sit <>stand training carried over immediately to low couch in ADL apartment; pt fearful initially reporting "I can't do it"; reminded pt in anterior weight shift to improve ease of transfer, and pt ultimately able to perform x5 reps without assist. Car transfer, gait over unlevel surface and ramp with modI. Performed Berg scoring 52/56. Patient demonstrates increased fall risk as noted by score of   52/56 on Berg Balance Scale.  (<36= high risk for falls, close to 100%; 37-45 significant >80%; 46-51 moderate >50%; 52-55 lower >25%). Remained seated in chair in gym with handoff to OT for next session.   Therapy Documentation Precautions:  Precautions Precautions: Fall, Cervical Required Braces or Orthoses: Cervical Brace Cervical Brace: Hard collar, At all times Restrictions Weight Bearing  Restrictions: No Balance: Standardized Balance Assessment Standardized Balance Assessment: Berg Balance Test Berg Balance Test Sit to Stand: Able to stand without using hands and stabilize independently Standing Unsupported: Able to stand safely 2 minutes Sitting with Back Unsupported but Feet Supported on Floor or Stool: Able to sit safely and securely 2 minutes Stand to Sit: Sits safely with minimal use of hands Transfers: Able to transfer safely, minor use of hands Standing Unsupported with Eyes Closed: Able to stand 10 seconds with supervision Standing Ubsupported with Feet Together: Able to place feet together independently and stand 1 minute safely From Standing, Reach Forward with Outstretched Arm: Can reach confidently >25 cm (10") From Standing Position, Pick up Object from Floor: Able to pick up shoe, needs supervision From Standing Position, Turn to Look Behind Over each Shoulder: Looks behind from both sides and weight shifts well Turn 360 Degrees: Able to turn 360 degrees safely in 4 seconds or less Standing Unsupported, Alternately Place Feet on Step/Stool: Able to stand independently and safely and complete 8 steps in 20 seconds Standing Unsupported, One Foot in Front: Able to plae foot ahead of the other independently and hold 30 seconds Standing on One Leg: Able to lift leg independently and hold 5-10 seconds Total Score: 52   See Function Navigator for Current Functional Status.   Therapy/Group: Individual Therapy  Luberta Mutter 10/07/2015, 9:59 AM

## 2015-10-07 NOTE — Progress Notes (Signed)
Occupational Therapy Note  Patient Details  Name: MARVELL LANTRIP MRN: [id number] Date of Birth: 1927-11-11  Today's Date: 10/07/2015 OT Individual Time: 1000-1100 OT Individual Time Calculation (min): 60 min    Pt denied pain Individual Therapy  Pt initially engaged in handwriting tasks/practice.  Pt stated she noted an improvement. Pt transitioned to [facility] tasks including removing and replacing caps on medicine bottles.  Pt noted with decreased grip strength.  Pt issued mod soft theraputty and handout of exercises to complete to strengthen grasp.  Pt return demonstrated exercises.  Pt returned to room and remained in recliner awaiting next therapy.   Leotis Shames [facility] 10/07/2015, 12:18 PM

## 2015-10-07 NOTE — Progress Notes (Signed)
Occupational Therapy Session Note  Patient Details  Name: Terri Bradley MRN: [id number] Date of Birth: 1927/04/15  Today's Date: 10/07/2015 OT Individual Time: [id number] OT Individual Time Calculation (min): 60 min     Short Term Goals:Week 1:  OT Short Term Goal 1 (Week 1): Pt will complete toileting task with supervision OT Short Term Goal 2 (Week 1): Pt will complete kitchen mobility task with supervision using LRAD OT Short Term Goal 3 (Week 1): Pt will complete bathing task with supervision  Skilled Therapeutic Interventions/Progress Updates:    Pt seen for OT ADL bathing/dressing session. Pt in supine upon arrival, agreeable to tx session. She declined showering task, opting to bathe at sink. She ambulated throughout room without AD to gather clothing items. She completed bathing/dressing with set-up assist- mod I. Pt demonstrated ability to don/doff bra independently, a goal family had for pt.  Grooming tasks completed standing at sink. Pt voiced impaired sensation in B UEs while washing hands. Educated regarding sensation loss and functional safety implications. She ambulated to gym ith distant supervision and no AD. While in gym, pt completed dynamic ambulation and balance tasks including balancing ball on tennis racket while ambulating, tossing ball back and forth with therapist while side stepping and hitting ball with racket to therapist. Pt with 2 LOB episodes during dynamic tasks, requiring min A to regain balance. Seated rest breaks with supported back required btwn tasks.  She completed zoom ball activity in sitting for UE strengthening and endurance.  Pt returned to room at end of session, left in seated position with all needs in reach.   Therapy Documentation Precautions:  Precautions Precautions: Fall, Cervical Required Braces or Orthoses: Cervical Brace Cervical Brace: Hard collar, At all times Restrictions Weight Bearing Restrictions: No Pain:   No/ denies  pain  See Function Navigator for Current Functional Status.   Therapy/Group: Individual Therapy  Lewis, Davonte Siebenaler C 10/07/2015, 7:08 AM

## 2015-10-07 NOTE — Progress Notes (Signed)
[place] PHYSICAL MEDICINE & REHABILITATION     PROGRESS NOTE    Subjective/Complaints: Sitting in bed. No new issues. "when are my hands going to work?" feels some paresthesias   ROS: Pt denies fever, rash/itching, headache, blurred or double vision, nausea, vomiting, abdominal pain, diarrhea, chest pain, shortness of breath, palpitations, dysuria, dizziness,   back pain, bleeding,  or depression   Objective: Vital Signs: Blood pressure (!) 155/56, pulse 69, temperature 98.1 F (36.7 C), temperature source Oral, resp. rate 17, height 5\' 9"  (1.753 m), weight 82.4 kg (181 lb 11.2 oz), SpO2 96 %. No results found. No results for input(s): WBC, HGB, HCT, PLT in the last 72 hours. No results for input(s): NA, K, CL, GLUCOSE, BUN, CREATININE, CALCIUM in the last 72 hours.  Invalid input(s): CO CBG (last 3)  No results for input(s): GLUCAP in the last 72 hours.  Wt Readings from Last 3 Encounters:  10/01/15 82.4 kg (181 lb 11.2 oz)  09/27/15 79.4 kg (175 lb 0.7 oz)    Physical Exam:  Constitutional: She is oriented to person, place, and time. She appears well-developed and well-nourished.  HENT:  Head: Normocephalic and atraumatic.  Mouth/Throat: Oropharynx is clear and moist.  Eyes: Conjunctivae are normal. Pupils are equal, round, and reactive to light. No scleral icterus.  Neck: c-collar Cardiovascular: Normal rate and regular rhythm.   Respiratory: Effort normal and breath sounds normal. No stridor. No respiratory distress. She has no wheezes.  GI: Soft. Bowel sounds are normal. She exhibits no distension. There is no tenderness.  Musculoskeletal: She exhibits no edema.  Neurological: She is alert and oriented to person, place, and time. No cranial nerve deficit.  Speech  clear.  Able to follow basic commands without difficulty.  Decreased sensation BUE.   Motor: RUE Shoulder abduction, elbow flexion, wrist extension 4+/5, elbow extension, hand grip 4/5 B/l UE Shoulder  abduction, elbow flexion, wrist extension 4+/5, elbow extension, hand grip 4-/5 B/l LE: Hip flexion 4+/5, knee extension 4+/5, ankle dorsi/plantar flexion 4+/5 DTRs symmetric Skin: elbow lacs healing, small red area on sacrum---no break--appears to be resolving.  Psychiatric: She has a normal mood and affect. Her behavior is normal. Judgment and thought content normal  Assessment/Plan: 1. Functional and mobility deficits secondary to central cord injury which require 3+ hours per day of interdisciplinary therapy in a comprehensive inpatient rehab setting. Physiatrist is providing close team supervision and 24 hour management of active medical problems listed below. Physiatrist and rehab team continue to assess barriers to discharge/monitor patient progress toward functional and medical goals.  Function:  Bathing Bathing position   Position: Wheelchair/chair at sink  Bathing parts Body parts bathed by patient: Right arm, Left arm, Chest, Abdomen, Front perineal area, Right upper leg, Left upper leg, Right lower leg, Left lower leg, Buttocks, Back Body parts bathed by helper: Buttocks, Back  Bathing assist Assist Level: Supervision or verbal cues, Set up   Set up : To obtain items  Upper Body Dressing/Undressing Upper body dressing   What is the patient wearing?: Orthosis, Button up shirt     Pull over shirt/dress - Perfomed by patient: Thread/unthread right sleeve, Thread/unthread left sleeve, Put head through opening, Pull shirt over trunk   Button up shirt - Perfomed by patient: Thread/unthread right sleeve, Thread/unthread left sleeve, Pull shirt around back, Button/unbutton shirt   Orthosis activity level: Performed by helper  Upper body assist Assist Level: Supervision or verbal cues   Set up : To apply TLSO,  cervical collar  Lower Body Dressing/Undressing Lower body dressing   What is the patient wearing?: Underwear, Pants, Shoes Underwear - Performed by patient:  Thread/unthread right underwear leg, Thread/unthread left underwear leg, Pull underwear up/down   Pants- Performed by patient: Thread/unthread right pants leg, Thread/unthread left pants leg, Pull pants up/down           Shoes - Performed by patient: Don/doff right shoe, Don/doff left shoe            Lower body assist Assist for lower body dressing: Supervision or verbal cues      Toileting Toileting   Toileting steps completed by patient: Adjust clothing prior to toileting Toileting steps completed by helper: Performs perineal hygiene, Adjust clothing after toileting Toileting Assistive Devices: Grab bar or rail  Toileting assist Assist level: Touching or steadying assistance (Pt.75%)   Transfers Chair/bed transfer   Chair/bed transfer method: Ambulatory Chair/bed transfer assist level: Supervision or verbal cues Chair/bed transfer assistive device: Medical sales representative     Max distance: > 1000' Assist level: Supervision or verbal cues   Wheelchair     Max wheelchair distance: 100 Assist Level: Supervision or verbal cues  Cognition Comprehension Comprehension assist level: Follows basic conversation/direction with no assist  Expression Expression assist level: Expresses basic needs/ideas: With no assist  Social Interaction Social Interaction assist level: Interacts appropriately 90% of the time - Needs monitoring or encouragement for participation or interaction.  Problem Solving Problem solving assist level: Solves basic problems with no assist  Memory Memory assist level: Recognizes or recalls 90% of the time/requires cueing < 10% of the time   Medical Problem List and Plan: 1.  Gait instability, poor coordination, decreased [place] secondary to Central cord syndrome.  -continue inpatient therapies  -needs regular reinforcement about recovery/prognosis  -team conference today 2.  DVT Prophylaxis/Anticoagulation: SCD's. ambulating  3. Pain Management:   added low dose Neurontin to help with dysesthesias.   -Hydrocodone prn effective.  4. Mood: Stable and motivated. LCSW to follow for evaluation and support.  5. Neuropsych: This patient is capable of making decisions on her own behalf. 6. Skin/Wound Care: routine pressure relief measures.  Encourage po intake--family encouraged to bring supplements from home.  7. Fluids/Electrolytes/Nutrition: .  -encourage PO  -continue megace for appetite until dc 8. HTN:  Monitor BID--continue HCTZ/prinivil 9. Urinary retention/neurogenic bladder: toileting  patient every 4 hours.  -emptying bladder without issues currently  -UA negative, UCX negative also 10. Neurogenic bowel:   senna at bedtime.     LOS (Days) 6 A FACE TO FACE EVALUATION WAS PERFORMED  Aimee Timmons T 10/07/2015 8:24 AM

## 2015-10-08 ENCOUNTER — Inpatient Hospital Stay (HOSPITAL_COMMUNITY): Payer: Medicare Other | Admitting: Occupational Therapy

## 2015-10-08 ENCOUNTER — Inpatient Hospital Stay (HOSPITAL_COMMUNITY): Payer: Self-pay | Admitting: Physical Therapy

## 2015-10-08 ENCOUNTER — Inpatient Hospital Stay (HOSPITAL_COMMUNITY): Payer: Medicare Other | Admitting: Physical Therapy

## 2015-10-08 MED ORDER — SENNA 8.6 MG PO TABS: 1.0000 | ORAL_TABLET | Freq: Every day | ORAL | 0 refills | Status: DC

## 2015-10-08 MED ORDER — MEGESTROL ACETATE 400 MG/10ML PO SUSP: 400.0000 mg | Freq: Every day | ORAL | Status: DC

## 2015-10-08 MED ORDER — GABAPENTIN 100 MG PO CAPS: 100.0000 mg | ORAL_CAPSULE | Freq: Every day | ORAL | 0 refills | Status: DC

## 2015-10-08 MED ORDER — TRAZODONE HCL 50 MG PO TABS: 25.0000 mg | ORAL_TABLET | Freq: Every evening | ORAL | 0 refills | Status: DC | PRN

## 2015-10-08 MED ORDER — ACETAMINOPHEN 325 MG PO TABS: 325.0000 mg | ORAL_TABLET | ORAL | Status: AC | PRN

## 2015-10-08 NOTE — Progress Notes (Signed)
Physical Therapy Session Note  Patient Details  Name: Terri Bradley MRN: [id number] Date of Birth: [birth year]  Today's Date: 10/08/2015 PT Individual Time: 0626-9485 AND 1100-1157 PT Individual Time Calculation (min): 57 min AND 57 min    Short Term Goals: Week 1:  PT Short Term Goal 1 (Week 1): Pt will perform bed mobility with S PT Short Term Goal 2 (Week 1): Pt will perform stand pivot transfers with S PT Short Term Goal 3 (Week 1): Pt will ambulate 200' with S and LRAD PT Short Term Goal 4 (Week 1): Pt will ascend/descend 12 stairs with BUE and S  Skilled Therapeutic Interventions/Progress Updates:       Session1  Patient received sitting in recliner and agreeable to therapy.  Gait training in controlled environment for 222f without AD, no cues or assistance from PT.   PT instructed patient in Car transfer without cues or assistance, but noted to require increased time and effort  Patient instructed in Gait on ramp and unlevel surface with out cues or assistance from PT, noted to have slight LOB, but corrected without Assist from PT.    Patient instructed and gait training in simulated community environment Through gift shop and food court for 2036fwithout cues or assistance from PT. Patient demonstrated improved functional balance and able to reach forward to look at objects throughout gift shop.     PT instructed patient in grad day assessment see discharge summary.    Patient left sitting in recliner with call bell in reach.   Session 2:  Patient received sitting in recliner and agreeable to PT.   PT instructed patient in balance training on Wii fit with visual feedback for weight shifting in all planes; balance table, penguin, soccer, bubble run; all x 3 with min  A from PT to prevent lateral LOB and mod cues for improved use of ankle strategy to correct LOB.   [place] level C exercises. With close supervision A form PT and mod cues for improved technique and use of  stepping and ankle strategy to correct LOB.   Figure 8 x 3  Side stepping 1560f 2.  Toe walking [id number].  Heel walking. 62ft59f  5x sit<>stand 18 seconds.   Mini squats x 10  SLS up to 6 seconds. X 2 BLE.  Patient performed gait training without cues or assist from PT for 200ft 21fin controlled environment.   Patient left sitting in recliner with call bell in reach with all needs met at end of treatment.      Therapy Documentation Precautions:  Precautions Precautions: Cervical Required Braces or Orthoses: Cervical Brace Cervical Brace: Hard collar, At all times Restrictions Weight Bearing Restrictions: No  See Function Navigator for Current Functional Status.   Therapy/Group: Individual Therapy  AustinLorie [id number], 3:16 PM

## 2015-10-08 NOTE — Discharge Summary (Signed)
Physician Discharge Summary  Patient ID: Terri Bradley MRN: [id number] DOB/AGE: Jan 14, 1928 82 y.o.  Admit date: 10/01/2015 Discharge date: 10/09/2015  Discharge Diagnoses:  Principal Problem:   Central cord syndrome at C6 level of cervical spinal cord [facility]) Active Problems:   Neurologic gait disorder   Neuropathic pain   Post-operative pain   Neurogenic bladder   Neurogenic bowel   Discharged Condition: stable   Labs:  Basic Metabolic Panel: BMP Latest Ref Rng & Units 10/02/2015 09/26/2015  Glucose 65 - 99 mg/dL 97 121(H)  BUN 6 - 20 mg/dL 17 15  Creatinine 0.44 - 1.00 mg/dL 0.69 0.78  Sodium 135 - 145 mmol/L 136 138  Potassium 3.5 - 5.1 mmol/L 3.6 3.7  Chloride 101 - 111 mmol/L 97(L) 104  CO2 22 - 32 mmol/L 31 21(L)  Calcium 8.9 - 10.3 mg/dL 8.6(L) 9.5    CBC: CBC Latest Ref Rng & Units 10/02/2015 09/26/2015  WBC 4.0 - 10.5 K/uL 10.0 10.8(H)  Hemoglobin 12.0 - 15.0 g/dL 10.0(L) 13.0  Hematocrit 36.0 - 46.0 % 31.4(L) 39.3  Platelets 150 - 400 K/uL 253 286    CBG: No results for input(s): GLUCAP in the last 168 hours.  Brief HPI:    Terri Dicello Mcphersonis a 82 y.o femalewith history of hypertension otherwise in relatively good health. She was admitted on  09/26/2015 after a fall on an escalator while attending a convention meeting. She backwards down several steps and was unable to move BUE/BLE initially. On evaluation in ED she was able to move BLE but complained of burning and weakness BUE as well as pain at base of neck. MRI cervical spine revealed anterior superior endplate nondisplaced fracture C7, focal longitudinal ligament disruption at C6-C7 abnormal cord signal C7 concerning for cord contusion, moderate canal stenosis C5-C7 and R>L paraspinal and scalene muscle strain. Collar for stabilization and conservative care recommended by Dr. Annette Stable for management of central cord syndrome.  Patient continued to have weakness/numbness BUE and follow up flex-extensive views  showed significant ligamentous instability at C6-C7.   She  underwent ACDF C6-C7 on 08/28 by Dr. Ronnald Ramp. Post op has had problems with urinary retention as well as deficits in balance, BUE weakness as well as difficulty with ADL tasks.  CIR recommended for follow up therapy   Hospital Course: Terri Bradley was admitted to rehab 10/01/2015 for inpatient therapies to consist of PT and OT at least three hours five days a week. Past admission physiatrist, therapy team and rehab RN have worked together to provide customized collaborative inpatient rehab. Blood pressures have been stable and megace was added to help with anorexia. Bladder function has improved and she is voiding without difficulty. Urine culture was negative for infection. Follow up labs revealed ABLA and protein calorie malnutrition. Supplements were offered between meals and intake has greatly improved therefore megace was discontinued at discharge. Neck incision is healing well without s/s of infection.  . Gabapentin was added to manage neuropathy and pain has been well controlled.  She has had improvement in balance as well as motor control/strength of BUE and is modified independent at discharge. She will continue to receive follow up HHPT  by [facility] at home after discharge.    Rehab course: During patient's stay in rehab weekly team conferences were held to monitor patient's progress, set goals and discuss barriers to discharge. At admission, patient required min assist with basic self care tasks and mobility. She has had improvement in activity tolerance, balance, postural control,  as well as ability to compensate for deficits.  She is able to complete ADL tasks at modified independent level and needs supervision with shower transfers. She is modified independent for transfers and is able to ambulate  250' without AD in supervised setting.    Disposition:  Home   Diet: Regular.   Special Instructions: 1. Wear collar at all  times. 2.  Needs follow up CBC in 7-10 days.    Discharge Instructions    Ambulatory referral to Physical Medicine Rehab    Complete by:  As directed   1-2 week follow up       Medication List    TAKE these medications   acetaminophen 325 MG tablet Commonly known as:  TYLENOL Take 1-2 tablets (325-650 mg total) by mouth every 4 (four) hours as needed for mild pain.   calcium carbonate 1500 (600 Ca) MG Tabs tablet Commonly known as:  OSCAL Take 1,500 mg by mouth 2 (two) times daily with a meal.   gabapentin 100 MG capsule Commonly known as:  NEURONTIN Take 1 capsule (100 mg total) by mouth at bedtime.   lisinopril-hydrochlorothiazide 20-25 MG tablet Commonly known as:  PRINZIDE,ZESTORETIC Take 1 tablet by mouth daily.   multivitamin with minerals Tabs tablet Take 1 tablet by mouth every evening.   senna 8.6 MG Tabs tablet Commonly known as:  SENOKOT Take 1 tablet (8.6 mg total) by mouth at bedtime.   traZODone 50 MG tablet Commonly known as:  DESYREL Take 0.5-1 tablets (25-50 mg total) by mouth at bedtime as needed for sleep.   Vitamin D 2000 units tablet Take 2,000 Units by mouth daily.      Follow-up Information    Rusty Aus, MD Follow up on 10/16/2015.   Specialty:  Internal Medicine Why:  @ 2:45 pm (hospital follow up) Contact information: [place] [address] [phone]        Eustace Moore, MD. Call today.   Specialty:  Neurosurgery Why:  for follow up appointment Contact information: [address] [place] [address] [phone]        Meredith Staggers, MD .   Specialty:  Physical Medicine and Rehabilitation Why:  office will call you for follow up appointment Contact information: [place] [address] [phone]           Signed: Bary Leriche 10/09/2015, 5:27 PM

## 2015-10-08 NOTE — Patient Care Conference (Signed)
Inpatient RehabilitationTeam Conference and Plan of Care Update Date: 10/07/2015   Time: 2:15 PM    Patient Name: Terri Bradley      Medical Record Number: [id number]  Date of Birth: 06-01-1927 Sex: Female         Room/Bed: 4W20C/4W20C-01 Payor Info: Payor: [employer] MEDICARE / Plan: [employer] MEDICARE / Product Type: *No Product type* /    Admitting Diagnosis: central cord  Admit Date/Time:  10/01/2015  3:02 PM Admission Comments: No comment available   Primary Diagnosis:  <principal problem not specified> Principal Problem: <principal problem not specified>  Patient Active Problem List   Diagnosis Date Noted  . Central cord syndrome at C6 level of cervical spinal cord ([place]) 10/01/2015  . Neurologic gait disorder   . Neuropathic pain   . Post-operative pain   . Neurogenic bladder   . Neurogenic bowel   . Cervical vertebral fusion 09/29/2015  . Spinal cord injury at C5-C7 level without injury of spinal bone ([place])   . Benign essential HTN   . Acute neck pain   . Leukocytosis   . Contusion of cervical cord ([place]) 09/26/2015  . Central cord syndrome [facility]) 09/26/2015    Expected Discharge Date: Expected Discharge Date: 10/09/15  Team Members Present: Physician leading conference: Dr. Alger Simons Social Worker Present: [name], LCSW Nurse Present: Heather Roberts, RN PT Present: Kem Parkinson, PT OT Present: Napoleon Form, OT SLP Present: Weston Anna, SLP PPS Coordinator present : Daiva Nakayama, RN, CRRN     Current Status/Progress Goal Weekly Team Focus  Medical   cervical central cord. bowel and bladde normalzing . still with dexterity issues in hands. pain improved  improve pain, achieve adequate daily nutritional intake  pain control, SCI education, nutrition   Bowel/Bladder   continent of bowel/bladder. LBM 10/06/15  Patient to remain continent of bowel/bladder while in [facility] with min assist.  Monitor bowel/bladder function q shift and as needed.    Swallow/Nutrition/ Hydration             ADL's   Supervision- mod I overall  mod I overall with supervision for shower transfers  ADL/ IADL re-training, d/c planning, family ed, UE strengthening   Mobility   modI overall, S stairs and higher level balance  modI overall, S stairs  dynamic standing balance, activity tolerance, LE strength   Communication             Safety/Cognition/ Behavioral Observations            Pain   Denied any pain or discomfort.  <3  Monitor and treat pain q shift and as needed   Skin   Surgical incision on R neck with honeycomb dressing; L elbow skin tear.  Patient skin to be free of new skin breakdown/infection while in [facility] with min assist.  Assess skin q shift and as needed    Rehab Goals Patient on target to meet rehab goals: Yes *See Care Plan and progress notes for long and short-term goals.  Barriers to Discharge: anxiety, realistic expectations    Possible Resolutions to Barriers:  ongoing education, NMR, reassurance    Discharge Planning/Teaching Needs:  Home with family able to provide intermittent assistance.  They can hire private duty assist if recommended.  PT has completed education but still need to educate daughter about donning brace.   Team Discussion:  Making good progress and on track to meet mod independent goals (currently supervision).  No concerns.  Revisions to Treatment Plan:  None   Continued Need for Acute Rehabilitation Level of Care: The patient requires daily medical management by a physician with specialized training in physical medicine and rehabilitation for the following conditions: Daily direction of a multidisciplinary physical rehabilitation program to ensure safe treatment while eliciting the highest outcome that is of practical value to the patient.: Yes Daily medical management of patient stability for increased activity during participation in an intensive rehabilitation regime.: Yes Daily analysis of  laboratory values and/or radiology reports with any subsequent need for medication adjustment of medical intervention for : Post surgical problems;Neurological problems  Thien Berka 10/08/2015, 12:56 PM

## 2015-10-08 NOTE — Progress Notes (Signed)
[place] PHYSICAL MEDICINE & REHABILITATION     PROGRESS NOTE    Subjective/Complaints: Feeling better about discharge. Happy to be going home.   ROS: Pt denies fever, rash/itching, headache, blurred or double vision, nausea, vomiting, abdominal pain, diarrhea, chest pain, shortness of breath, palpitations, dysuria, dizziness,   back pain, bleeding,  or depression   Objective: Vital Signs: Blood pressure 140/70, pulse 72, temperature 98.1 F (36.7 C), temperature source Oral, resp. rate 16, height 5\' 9"  (1.753 m), weight 82.4 kg (181 lb 11.2 oz), SpO2 98 %. No results found. No results for input(s): WBC, HGB, HCT, PLT in the last 72 hours. No results for input(s): NA, K, CL, GLUCOSE, BUN, CREATININE, CALCIUM in the last 72 hours.  Invalid input(s): CO CBG (last 3)  No results for input(s): GLUCAP in the last 72 hours.  Wt Readings from Last 3 Encounters:  10/01/15 82.4 kg (181 lb 11.2 oz)  09/27/15 79.4 kg (175 lb 0.7 oz)    Physical Exam:  Constitutional: She is oriented to person, place, and time. She appears well-developed and well-nourished.  HENT:  Head: Normocephalic and atraumatic.  Mouth/Throat: Oropharynx is clear and moist.  Eyes: Conjunctivae are normal. Pupils are equal, round, and reactive to light. No scleral icterus.  Neck: c-collar Cardiovascular: Normal rate and regular rhythm.   Respiratory: Effort normal and breath sounds normal. No stridor. No respiratory distress. She has no wheezes.  GI: Soft. Bowel sounds are normal. She exhibits no distension. There is no tenderness.  Musculoskeletal: She exhibits no edema.  Neurological: She is alert and oriented to person, place, and time. No cranial nerve deficit.  Speech  clear.  Able to follow basic commands without difficulty.  Decreased sensation BUE.   Motor: RUE Shoulder abduction, elbow flexion, wrist extension 4+/5, elbow extension, hand grip 4/5 B/l UE Shoulder abduction, elbow flexion, wrist  extension 4+/5, elbow extension, hand grip 4-/5--unchanged B/l LE: Hip flexion 4+/5, knee extension 4+/5, ankle dorsi/plantar flexion 4+/5 DTRs symmetric Skin: elbow lacs healing, small red area on sacrum---no break--appears to be resolving.  Psychiatric: She has a normal mood and affect. Her behavior is normal. Judgment and thought content normal  Assessment/Plan: 1. Functional and mobility deficits secondary to central cord injury which require 3+ hours per day of interdisciplinary therapy in a comprehensive inpatient rehab setting. Physiatrist is providing close team supervision and 24 hour management of active medical problems listed below. Physiatrist and rehab team continue to assess barriers to discharge/monitor patient progress toward functional and medical goals.  Function:  Bathing Bathing position   Position: Wheelchair/chair at sink  Bathing parts Body parts bathed by patient: Right arm, Left arm, Chest, Abdomen, Front perineal area, Right upper leg, Left upper leg, Right lower leg, Left lower leg, Buttocks, Back Body parts bathed by helper: Buttocks, Back  Bathing assist Assist Level: Supervision or verbal cues, More than reasonable time   Set up : To obtain items  Upper Body Dressing/Undressing Upper body dressing   What is the patient wearing?: Orthosis, Button up shirt, Bra Bra - Perfomed by patient: Thread/unthread right bra strap, Thread/unthread left bra strap, Hook/unhook bra (pull down sports bra)   Pull over shirt/dress - Perfomed by patient: Thread/unthread right sleeve, Thread/unthread left sleeve, Put head through opening, Pull shirt over trunk   Button up shirt - Perfomed by patient: Thread/unthread right sleeve, Thread/unthread left sleeve, Pull shirt around back, Button/unbutton shirt   Orthosis activity level: Performed by helper  Upper body assist Assist Level:  More than reasonable time   Set up : To apply TLSO, cervical collar  Lower Body  Dressing/Undressing Lower body dressing   What is the patient wearing?: Underwear, Pants, Shoes Underwear - Performed by patient: Thread/unthread right underwear leg, Thread/unthread left underwear leg, Pull underwear up/down   Pants- Performed by patient: Thread/unthread right pants leg, Thread/unthread left pants leg, Pull pants up/down           Shoes - Performed by patient: Don/doff right shoe, Don/doff left shoe            Lower body assist Assist for lower body dressing: More than reasonable time      Toileting Toileting   Toileting steps completed by patient: Adjust clothing prior to toileting, Performs perineal hygiene, Adjust clothing after toileting Toileting steps completed by helper: Performs perineal hygiene, Adjust clothing after toileting Toileting Assistive Devices: Grab bar or rail  Toileting assist Assist level: More than reasonable time   Transfers Chair/bed transfer   Chair/bed transfer method: Ambulatory Chair/bed transfer assist level: No Help, no cues, assistive device, takes more than a reasonable amount of time Chair/bed transfer assistive device: Medical sales representative     Max distance: 300 Assist level: No help, No cues, assistive device, takes more than a reasonable amount of time   Wheelchair     Max wheelchair distance: 100 Assist Level: Supervision or verbal cues  Cognition Comprehension Comprehension assist level: Follows complex conversation/direction with no assist  Expression Expression assist level: Expresses complex ideas: With no assist, Expresses basic needs/ideas: With no assist  Social Interaction Social Interaction assist level: Interacts appropriately 90% of the time - Needs monitoring or encouragement for participation or interaction.  Problem Solving Problem solving assist level: Solves basic problems with no assist  Memory Memory assist level: Recognizes or recalls 90% of the time/requires cueing < 10% of the  time   Medical Problem List and Plan: 1.  Gait instability, poor coordination, decreased [place] secondary to Central cord syndrome.  -continue inpatient therapies  -home Thursday--HH to outpt therapies (pt would prefer home to start) 2.  DVT Prophylaxis/Anticoagulation: SCD's. ambulating  3. Pain Management:  added low dose Neurontin to help with dysesthesias.   -Hydrocodone prn effective.  4. Mood: Stable and motivated. LCSW to follow for evaluation and support.  5. Neuropsych: This patient is capable of making decisions on her own behalf. 6. Skin/Wound Care: routine pressure relief measures.  Encourage po intake--family encouraged to bring supplements from home.  7. Fluids/Electrolytes/Nutrition: .  -encourage PO  -continue megace for appetite until dc home 8. HTN:  Monitor BID--continue HCTZ/prinivil 9. Urinary retention/neurogenic bladder:    -emptying bladder without issues currently  -UA negative, UCX negative also 10. Neurogenic bowel:   senna at bedtime.     LOS (Days) 7 A FACE TO FACE EVALUATION WAS PERFORMED  SWARTZ,ZACHARY T 10/08/2015 8:58 AM

## 2015-10-08 NOTE — Progress Notes (Signed)
Occupational Therapy Session Note  Patient Details  Name: Terri Bradley MRN: [id number] Date of Birth: 10-08-1927  Today's Date: 10/08/2015 OT Individual Time: [id number] OT Individual Time Calculation (min): 60 min     Short Term Goals:Week 1:  OT Short Term Goal 1 (Week 1): Pt will complete toileting task with supervision OT Short Term Goal 2 (Week 1): Pt will complete kitchen mobility task with supervision using LRAD OT Short Term Goal 3 (Week 1): Pt will complete bathing task with supervision  Skilled Therapeutic Interventions/Progress Updates:    Pt seen for OT ADL bathing/dressing session. Pt sitting up in recliner upon arrival, agreeable to tx session. She ambulated throughout room without AD mod I to gather items in prep for showering task. Bathing/dressing completed with distant supervision- mod I; see functional navigator for details of assist. In ADL apartment, reviewed shower chair vs. Tub bench for home use. Tub bench unable to work in home bathroom due to glass door. Pt completed simulated tub/shower transfer to shower seat stepping over wall with supervision and VCs for technique. In therapy gym, pt completed 9 hole peg test, see results below. Then completed fine motor activity utilizing small buttons in simulation of dressing task, completed with increased time.  Pt ambulated back to room at end of session, left with all needs in reach. Educated throughout session regarding continuum of care, DME, home bathroom layout and safety considerations, and d/c planning.   9 Hole Peg Test:  R: 29.49sec, 24.88sec and 26.60 sec AVG: 26.99sec L: 29.52sec, 27.22sec. And 27.85 sec AVG 28.19 sec  Therapy Documentation Precautions:  Precautions Precautions: Fall, Cervical Required Braces or Orthoses: Cervical Brace Cervical Brace: Hard collar, At all times Restrictions Weight Bearing Restrictions: No Pain:    See Function Navigator for Current Functional  Status.   Therapy/Group: Individual Therapy  Lewis, Neta Upadhyay C 10/08/2015, 7:13 AM

## 2015-10-08 NOTE — Progress Notes (Signed)
Physical Therapy Discharge Summary  Patient Details  Name: Terri Bradley MRN: [id number] Date of Birth: 05/13/1927  Today's Date: 10/08/2015 PT Individual Time: 1100-1200 PT Individual Time Calculation (min): 60 min     Patient has met 9 of 9 long term goals due to improved activity tolerance, improved balance, improved postural control, increased strength, ability to compensate for deficits, functional use of  right upper extremity, right lower extremity, left upper extremity and left lower extremity, improved awareness and improved coordination.  Patient to discharge at an ambulatory level Modified Independent.   Patient's family has been educated regarding pt's current functional level, and will be available to provide intermittent S .  Reasons goals not met: All goals met  Recommendation:  Patient will benefit from ongoing skilled PT services in home health setting to continue to advance safe functional mobility, address ongoing impairments in strength, activity tolerance, balance, and minimize fall risk.  Equipment: No equipment provided  Reasons for discharge: treatment goals met and discharge from hospital  Patient/family agrees with progress made and goals achieved: Yes  PT Discharge Precautions/RestrictionsPrecautions Precautions: Cervical Required Braces or Orthoses: Cervical Brace Cervical Brace: Hard collar;At all times Restrictions Weight Bearing Restrictions: No Pain : 0/10  Cognition Overall Cognitive Status: Within Functional Limits for tasks assessed Orientation Level: Oriented X4 Memory: Appears intact Awareness: Appears intact Problem Solving: Appears intact Safety/Judgment: Appears intact Sensation Sensation Light Touch: Appears Intact (BLE. ) Stereognosis: Not tested Hot/Cold: Not tested Proprioception: Appears Intact Coordination Gross Motor Movements are Fluid and Coordinated: Yes Heel Shin Test: [facility] Motor  Motor Motor: Within Functional  Limits Motor - Skilled Clinical Observations: paraparesis BUEs  Mobility Bed Mobility Bed Mobility: Rolling Right;Sit to Supine;Supine to Sit Rolling Right: 5: Supervision Rolling Left: 5: Supervision Supine to Sit: 6: Modified independent (Device/Increase time) Sit to Supine: 6: Modified independent (Device/Increase time) Transfers Transfers: Yes Sit to Stand: 6: Modified independent (Device/Increase time) Stand to Sit: 6: Modified independent (Device/Increase time) Stand Pivot Transfers: 6: Modified independent (Device/Increase time) Locomotion  Ambulation Ambulation: Yes Ambulation/Gait Assistance: 6: Modified independent (Device/Increase time) Ambulation Distance (Feet): 400 Feet Assistive device: None Gait Gait: Yes Gait Pattern: Impaired Gait Pattern: Narrow base of support Stairs / Additional Locomotion Stairs: Yes Stairs Assistance: 6: Modified independent (Device/Increase time) Stair Management Technique: Two rails Number of Stairs: 12 Height of Stairs: 6 Ramp: 6: Modified independent (Device) Curb: 6: Modified independent (Device/increase time) Wheelchair Mobility Wheelchair Mobility: No  Trunk/Postural Assessment  Cervical Assessment Cervical Assessment: Exceptions to [facility] (Hard collar) Thoracic Assessment Thoracic Assessment: Exceptions to [facility] (Kyphotic) Lumbar Assessment Lumbar Assessment: Exceptions to [facility] (Posterior pelvic tilt) Postural Control Postural Control: Within Functional Limits (Generalized flexed/ slouched posture, however, WFL)  Balance Balance Balance Assessed: Yes Standardized Balance Assessment Standardized Balance Assessment: Dynamic Gait Index Dynamic Gait Index Level Surface: Normal Change in Gait Speed: Normal Gait with Horizontal Head Turns: Mild Impairment Gait with Vertical Head Turns: Normal Gait and Pivot Turn: Mild Impairment Step Over Obstacle: Mild Impairment Step Around Obstacles: Normal Steps: Mild Impairment Total  Score: 20 Static Sitting Balance Static Sitting - Balance Support: No upper extremity supported Static Sitting - Level of Assistance: 6: Modified independent (Device/Increase time) Dynamic Sitting Balance Dynamic Sitting - Balance Support: No upper extremity supported Dynamic Sitting - Level of Assistance: 6: Modified independent (Device/Increase time) Static Standing Balance Static Standing - Balance Support: No upper extremity supported Static Standing - Level of Assistance: 6: Modified independent (Device/Increase time) Dynamic Standing Balance Dynamic Standing -  Balance Support: No upper extremity supported Dynamic Standing - Level of Assistance: 6: Modified independent (Device/Increase time) Extremity Assessment   BUE WFL  BLE WFL        See Function Navigator for Current Functional Status.  Lorie Phenix 10/08/2015, 12:25 PM

## 2015-10-08 NOTE — Progress Notes (Signed)
Occupational Therapy Discharge Summary  Patient Details  Name: Terri Bradley MRN: [id number] Date of Birth: 1927/04/02   Patient has met 8 of 8 long term goals due to improved activity tolerance, improved balance, postural control, functional use of  RIGHT upper and LEFT upper extremity and improved coordination.  Patient to discharge at overall Modified Independent level with supervision for shower transfers. [name] family willing and able to provide necessary supervision assist.    Recommendation:  Patient will benefit from ongoing skilled OT services in home health setting to continue to advance functional skills in the area of BADL and iADL.  Equipment: Shower chair and 3-1 BSC  Reasons for discharge: treatment goals met and discharge from hospital  Patient/family agrees with progress made and goals achieved: Yes  OT Discharge Precautions/Restrictions  Precautions Precautions: Cervical Required Braces or Orthoses: Cervical Brace Cervical Brace: Hard collar;At all times Restrictions Weight Bearing Restrictions: No Vision/Perception  Vision- History Baseline Vision/History: Cataracts;Wears glasses Wears Glasses: Distance only Patient Visual Report: No change from baseline Vision- Assessment Vision Assessment?: No apparent visual deficits  Cognition Overall Cognitive Status: Within Functional Limits for tasks assessed Arousal/Alertness: Awake/alert Orientation Level: Oriented X4 Memory: Appears intact Awareness: Appears intact Problem Solving: Appears intact Safety/Judgment: Appears intact Sensation Sensation Light Touch: Impaired Detail Light Touch Impaired Details: Impaired RUE;Impaired LUE (Pt reports tingling in B hands, especially during bathing/ hand hygiene tasks) Coordination Gross Motor Movements are Fluid and Coordinated: Yes Fine Motor Movements are Fluid and Coordinated: Yes 9 Hole Peg Test: R: 29.49sec, 24.88sec and 26.60 sec AVG: 26.99sec  L:  29.52sec, 27.22sec. and 27.85sec AVG 28.19 sec Motor  Motor Motor: Within Functional Limits Motor - Discharge Observations: Paraparesis B UEs, however much improved since admission Mobility  Bed Mobility Bed Mobility: Rolling Right;Sit to Supine;Supine to Sit Rolling Right: 5: Supervision Rolling Left: 5: Supervision Supine to Sit: 6: Modified independent (Device/Increase time) Sit to Supine: 6: Modified independent (Device/Increase time) Transfers Sit to Stand: 6: Modified independent (Device/Increase time) Stand to Sit: 6: Modified independent (Device/Increase time)  Trunk/Postural Assessment  Cervical Assessment Cervical Assessment: Exceptions to [facility] (Hard Collar) Thoracic Assessment Thoracic Assessment: Exceptions to [facility] (Kyphotic) Lumbar Assessment Lumbar Assessment: Exceptions to [facility] (Posterior pelvic tilt) Postural Control Postural Control: Within Functional Limits  Balance Balance Balance Assessed: Yes Static Sitting Balance Static Sitting - Balance Support: No upper extremity supported;Feet supported Static Sitting - Level of Assistance: 7: Independent Dynamic Sitting Balance Dynamic Sitting - Balance Support: No upper extremity supported;Feet supported;During functional activity Dynamic Sitting - Level of Assistance: 6: Modified independent (Device/Increase time) Static Standing Balance Static Standing - Balance Support: No upper extremity supported;During functional activity Static Standing - Level of Assistance: 6: Modified independent (Device/Increase time) Dynamic Standing Balance Dynamic Standing - Balance Support: No upper extremity supported;During functional activity Dynamic Standing - Level of Assistance: 6: Modified independent (Device/Increase time) Extremity/Trunk Assessment RUE Assessment RUE Assessment: Exceptions to [facility] RUE AROM (degrees) Overall AROM Right Upper Extremity: Within functional limits for tasks performed RUE Strength RUE Overall  Strength: Within Functional Limits for tasks performed (4+/5 overall; 3-/5 extension) LUE Assessment LUE Assessment: Exceptions to WFL LUE AROM (degrees) Overall AROM Left Upper Extremity: Within functional limits for tasks assessed LUE Strength LUE Overall Strength: Deficits (4+/5 overall; 3-/5 extension)   See Function Navigator for Current Functional Status.  Lewis, Lior Cartelli C 10/08/2015, 3:32 PM

## 2015-10-08 NOTE — Progress Notes (Signed)
Social Work Patient ID: Consepcion Hearing, female   DOB: 07/30/1927, 82 y.o.   MRN: [id number]   Have reviewed team conference with patient and with daughter (via phone).  Both aware and agreeable with planned d/c date of 9/7 and mod independent goals.  Daughter reports that she is aware she still needs to be educated on brace care and plan to complete this prior to d/c tomorrow.  Have arranged HHPT and DME is ordered.    Terri Mena, LCSW

## 2015-10-08 NOTE — Progress Notes (Signed)
Patient concerned about left elbow skin tear/abrasion. tegaderm patch changed. Broken area to skin healing. Reviewed how to clean and dry area with patient's family. New tegaderm patch applied. Continue with plan of care.  Mliss Sax

## 2015-10-08 NOTE — Progress Notes (Signed)
Physical Therapy Session Note  Patient Details  Name: Terri Bradley MRN: [id number] Date of Birth: 13-Feb-1927  Today's Date: 10/08/2015 PT Individual Time: 1100-1200 PT Individual Time Calculation (min): 60 min    Short Term Goals: Week 1:  PT Short Term Goal 1 (Week 1): Pt will perform bed mobility with S PT Short Term Goal 2 (Week 1): Pt will perform stand pivot transfers with S PT Short Term Goal 3 (Week 1): Pt will ambulate 200' with S and LRAD PT Short Term Goal 4 (Week 1): Pt will ascend/descend 12 stairs with BUE and S  Skilled Therapeutic Interventions/Progress Updates:   Pt received seated in recliner, denies pain and agreeable to treatment. Gait to gym x150' with modI. Sitting/standing balance and UE strengthening while engaged in zoom ball; fatigues quickly and requires several rest breaks. Dribbling soccer ball in hallway for dynamic balance, weight shifting, coordination and physical/cognitive dual task; S overall for balance. Resisted sit <>stand and deadlift with level 1 theraband 2x12 reps each with min cues for technique. Dynamic gait training with ball catch/throw while side stepping, beach ball hits with 2# weighted bar while walking forward/backward; modI overall for dynamic balance. Returned to room with gait x150' modI. Remained seated in recliner at end of session, all needs in reach.   Therapy Documentation Precautions:  Precautions Precautions: Cervical Required Braces or Orthoses: Cervical Brace Cervical Brace: Hard collar, At all times Restrictions Weight Bearing Restrictions: No   See Function Navigator for Current Functional Status.   Therapy/Group: Individual Therapy  Luberta Mutter 10/08/2015, 11:54 AM

## 2015-10-08 NOTE — Progress Notes (Signed)
Recreational Therapy Assessment and Plan  Patient Details  Name: Terri Bradley MRN: [id number] Date of Birth: [birth year] Today's Date: 10/08/2015  Rehab Potential:  Good   ELOS:   discharge 10/09/15  Assessment Problem List:      Patient Active Problem List   Diagnosis Date Noted  . Central cord syndrome at C6 level of cervical spinal cord ([place]) 10/01/2015  . Neurologic gait disorder   . Neuropathic pain   . Post-operative pain   . Neurogenic bladder   . Neurogenic bowel   . Cervical vertebral fusion 09/29/2015  . Spinal cord injury at C5-C7 level without injury of spinal bone ([place])   . Benign essential HTN   . Acute neck pain   . Leukocytosis   . Contusion of cervical cord ([place]) 09/26/2015  . Central cord syndrome [facility]) 09/26/2015    Past Medical History:      Past Medical History:  Diagnosis Date  . Colon cancer ([place]) 2002  . Colon cancer (HCC)--stage 1   . Diverticulosis   . HTN (hypertension)   . Hypertension    Past Surgical History:       Past Surgical History:  Procedure Laterality Date  . ANTERIOR CERVICAL DECOMP/DISCECTOMY FUSION N/A 09/29/2015   Procedure: ANTERIOR CERVICAL DECOMPRESSION/DISCECTOMY CERVICAL SIX-SEVEN;  Surgeon: Eustace Moore, MD;  Location: [facility];  Service: Neurosurgery;  Laterality: N/A;  . BREAST BIOPSY Left 2013   core - neg  . CATARACT EXTRACTION    . colon tumor removal  2002   tumor removed "somewhere in colon, no treatment was ever needed."  . PARTIAL COLECTOMY Right 2010/002  . VAGINAL HYSTERECTOMY  1975   with oopherectomy    Assessment & Plan Clinical Impression: Terri Egle Mcphersonis a 82 y.o femalewith history of hypertension otherwise in relatively good health. She was admitted on 09/26/2015 after a fall on an escalator while attending a convention meeting. She backwards down several steps and was unable to move BUE/BLE initially. On evaluation in ED she was able to move BLE but  complained of burning and weakness BUE as well as pain at base of neck. MRI cervical spine revealed anterior superior endplate nondisplaced fracture C7, focal longitudinal ligament disruption at C6-C7 abnormal cord signal C7 concerning for cord contusion, moderate canal stenosis C5-C7 and R>L paraspinal and scalene muscle strain. Collar for stabilization and conservative care recommended by Dr. Annette Stable for management of central cord syndrome. Patient continued to have weakness/numbness BUE and follow up flex-extensive views showed significant ligamentous instability at C6-C7. Surgical intervention was recommended and she underwent ACDF C6-C7 on 08/28 by Dr. Ronnald Ramp. Post op has had problems voiding requiring in and out caths with volumes At 600-900 cc.  Patient transferred to CIR on 10/01/2015.    Plan  Met with pt during co-treat with PT to discuss use of leisure time post discharge.  Education provided on importance of staying active & engaged in activities of choice.  Also discussed potential activity modifications and community pursuits. No further TR as pt is discharging home tomorrow. Recommendations for other services: None  Discharge Criteria: Patient will be discharged from TR if patient refuses treatment 3 consecutive times without medical reason.  If treatment goals not met, if there is a change in medical status, if patient makes no progress towards goals or if patient is discharged from hospital.  The above assessment, treatment plan, treatment alternatives and goals were discussed and mutually agreed upon: by patient  [place] 10/08/2015, 3:53 PM

## 2015-10-09 NOTE — Discharge Instructions (Signed)
Inpatient Rehab Discharge Instructions  MISSY VANDERMOLEN Discharge date and time: 10/09/15   Activities/Precautions/ Functional Status: Activity: no lifting, driving, or strenuous exercise till cleared by MD Diet: regular diet Wound Care: keep wound clean and dry   Functional status:  ___ No restrictions     ___ Walk up steps independently ___ 24/7 supervision/assistance   ___ Walk up steps with assistance _X__ Intermittent supervision/assistance  ___ Bathe/dress independently ___ Walk with walker     ___ Bathe/dress with assistance ___ Walk Independently    ___ Shower independently ___ Walk with assistance    ___ Shower with assistance _X_ No alcohol     ___ Return to work/school ________     COMMUNITY REFERRALS UPON DISCHARGE:    Home Health:   PT                        Agency:  [facility]     Phone: [phone]    Medical Equipment/Items Ordered:  Bedside commode and tub seat                                                      Agency/Supplier:  [facility] @ [phone]    Special Instructions: 1. Needs supervision for transfers    My questions have been answered and I understand these instructions. I will adhere to these goals and the provided educational materials after my discharge from the hospital.  Patient/Caregiver Signature _______________________________ Date __________  Clinician Signature _______________________________________ Date __________  Please bring this form and your medication list with you to all your follow-up doctor's appointments.

## 2015-10-09 NOTE — Progress Notes (Signed)
Patient A/O, no noted distress/pain. Patient requested sleeping regimen at nine pm. Educated patient on the medication and on how to get the most effectiveness out of the med. Administered sleeping regimen at 2313. Patient tolerated meds well. Staff will continue to monitor and meet needs.

## 2015-10-09 NOTE — Progress Notes (Signed)
[place] PHYSICAL MEDICINE & REHABILITATION     PROGRESS NOTE    Subjective/Complaints: No new complaints. Happy to be going home today.    ROS: Pt denies fever, rash/itching, headache, blurred or double vision, nausea, vomiting, abdominal pain, diarrhea, chest pain, shortness of breath, palpitations, dysuria, dizziness,   back pain, bleeding,  or depression   Objective: Vital Signs: Blood pressure (!) 164/67, pulse 64, temperature 98.6 F (37 C), temperature source Oral, resp. rate 20, height 5\' 9"  (1.753 m), weight 84.1 kg (185 lb 6.5 oz), SpO2 96 %. No results found. No results for input(s): WBC, HGB, HCT, PLT in the last 72 hours. No results for input(s): NA, K, CL, GLUCOSE, BUN, CREATININE, CALCIUM in the last 72 hours.  Invalid input(s): CO CBG (last 3)  No results for input(s): GLUCAP in the last 72 hours.  Wt Readings from Last 3 Encounters:  10/08/15 84.1 kg (185 lb 6.5 oz)  09/27/15 79.4 kg (175 lb 0.7 oz)    Physical Exam:  Constitutional: She is oriented to person, place, and time. She appears well-developed and well-nourished.  HENT:  Head: Normocephalic and atraumatic.  Mouth/Throat: Oropharynx is clear and moist.  Eyes: Conjunctivae are normal. Pupils are equal, round, and reactive to light. No scleral icterus.  Neck: c-collar Cardiovascular: Normal rate and regular rhythm.   Respiratory: Effort normal and breath sounds normal. No stridor. No respiratory distress. She has no wheezes.  GI: Soft. Bowel sounds are normal. She exhibits no distension. There is no tenderness.  Musculoskeletal: She exhibits no edema.  Neurological: She is alert and oriented to person, place, and time. No cranial nerve deficit.  Speech  clear.  Able to follow basic commands without difficulty.  Decreased sensation BUE.   Motor: RUE Shoulder abduction, elbow flexion, wrist extension 4+/5, elbow extension, hand grip 4/5 B/l UE Shoulder abduction, elbow flexion, wrist extension  4+/5, elbow extension, hand grip 4-/5--unchanged B/l LE: Hip flexion 4+/5, knee extension 4+/5, ankle dorsi/plantar flexion 4+/5 DTRs symmetric Skin: elbow lacs healing, small red area on sacrum---no break--appears to be resolving.  Psychiatric: She has a normal mood and affect. Her behavior is normal. Judgment and thought content normal  Assessment/Plan: 1. Functional and mobility deficits secondary to central cord injury which require 3+ hours per day of interdisciplinary therapy in a comprehensive inpatient rehab setting. Physiatrist is providing close team supervision and 24 hour management of active medical problems listed below. Physiatrist and rehab team continue to assess barriers to discharge/monitor patient progress toward functional and medical goals.  Function:  Bathing Bathing position   Position: Shower  Bathing parts Body parts bathed by patient: Right arm, Left arm, Chest, Abdomen, Front perineal area, Right upper leg, Left upper leg, Right lower leg, Left lower leg, Buttocks, Back Body parts bathed by helper: Buttocks, Back  Bathing assist Assist Level: More than reasonable time   Set up : To obtain items  Upper Body Dressing/Undressing Upper body dressing   What is the patient wearing?: Orthosis, Button up shirt Bra - Perfomed by patient: Thread/unthread right bra strap, Thread/unthread left bra strap, Hook/unhook bra (pull down sports bra)   Pull over shirt/dress - Perfomed by patient: Thread/unthread right sleeve, Thread/unthread left sleeve, Put head through opening, Pull shirt over trunk   Button up shirt - Perfomed by patient: Thread/unthread right sleeve, Thread/unthread left sleeve, Pull shirt around back, Button/unbutton shirt   Orthosis activity level: Performed by helper  Upper body assist Assist Level: More than reasonable time, Set  up   Set up : To apply TLSO, cervical collar  Lower Body Dressing/Undressing Lower body dressing   What is the patient  wearing?: Underwear, Pants, Shoes Underwear - Performed by patient: Thread/unthread right underwear leg, Thread/unthread left underwear leg, Pull underwear up/down   Pants- Performed by patient: Thread/unthread right pants leg, Thread/unthread left pants leg, Pull pants up/down           Shoes - Performed by patient: Don/doff right shoe, Don/doff left shoe            Lower body assist Assist for lower body dressing: More than reasonable time      Toileting Toileting   Toileting steps completed by patient: Adjust clothing prior to toileting, Performs perineal hygiene, Adjust clothing after toileting Toileting steps completed by helper: Performs perineal hygiene, Adjust clothing after toileting Toileting Assistive Devices: Grab bar or rail  Toileting assist Assist level: No help/no cues   Transfers Chair/bed transfer   Chair/bed transfer method: Stand pivot Chair/bed transfer assist level: No Help, no cues, assistive device, takes more than a reasonable amount of time Chair/bed transfer assistive device: Orthosis     Locomotion Ambulation     Max distance: 215ft Assist level: No help, No cues, assistive device, takes more than a reasonable amount of time   Wheelchair Wheelchair activity did not occur: N/A   Max wheelchair distance: 100 Assist Level: Supervision or verbal cues  Cognition Comprehension Comprehension assist level: Follows complex conversation/direction with no assist  Expression Expression assist level: Expresses complex ideas: With no assist  Social Interaction Social Interaction assist level: Interacts appropriately with others - No medications needed.  Problem Solving Problem solving assist level: Solves complex problems: Recognizes & self-corrects  Memory Memory assist level: Recognizes or recalls 90% of the time/requires cueing < 10% of the time   Medical Problem List and Plan: 1.  Gait instability, poor coordination, decreased [place] secondary to  Central cord syndrome.  -continue inpatient therapies  -home today-HH to outpt therapies (pt would prefer home to start)  -Patient to see me in the office for transitional care encounter in 1-2 weeks. 2.  DVT Prophylaxis/Anticoagulation: SCD's. ambulating  3. Pain Management:  added low dose Neurontin to help with dysesthesias.   -Hydrocodone prn effective.  4. Mood: Stable and motivated. LCSW to follow for evaluation and support.  5. Neuropsych: This patient is capable of making decisions on her own behalf. 6. Skin/Wound Care: routine pressure relief measures.  Encourage po intake--family encouraged to bring supplements from home.  7. Fluids/Electrolytes/Nutrition: .  -encourage PO  -dc megace 8. HTN:  Monitor BID--continue HCTZ/prinivil 9. Urinary retention/neurogenic bladder:    -emptying bladder without issues currently  -UA negative, UCX negative also 10. Neurogenic bowel:   senna at bedtime.     LOS (Days) 8 A FACE TO FACE EVALUATION WAS PERFORMED  Terri Bradley T 10/09/2015 8:44 AM

## 2015-10-09 NOTE — Progress Notes (Signed)
Patient given discharge information by Algis Liming, PA, and all questions answered with patient's daughter in room. Patient assisted to car via wheelchair with nurse tech assistance. [place]

## 2015-10-09 NOTE — Progress Notes (Signed)
Social Work  Discharge Note  The overall goal for the admission was met for:   Discharge location: Yes - home with intermittent assist of family  Length of Stay: Yes - 8 days  Discharge activity level: Yes - modified independent  Home/community participation: Yes  Services provided included: MD, RD, PT, OT, RN, TR, Pharmacy and [place]: Medicare  Follow-up services arranged: Home Health: PT via [facility], DME: 3n1 commode and tub seat via [facility] and Patient/Family has no preference for HH/DME agencies  Comments (or additional information):  Patient/Family verbalized understanding of follow-up arrangements: Yes  Individual responsible for coordination of the follow-up plan: pt  Confirmed correct DME delivered: Terri Bradley 10/09/2015    Terri Bradley

## 2015-10-13 ENCOUNTER — Telehealth: Payer: Self-pay | Admitting: Physical Medicine & Rehabilitation

## 2015-10-13 NOTE — Telephone Encounter (Signed)
Pollyann Savoy PT with [facility] needs to get verbal orders for patient 1w1 and 2w4.  Please call him at [phone].

## 2015-10-13 NOTE — Telephone Encounter (Signed)
Approval given

## 2015-10-15 ENCOUNTER — Telehealth: Payer: Self-pay | Admitting: Physical Medicine & Rehabilitation

## 2015-10-15 NOTE — Telephone Encounter (Signed)
Terri Bradley called to let us know that patient has not heard anything from therapy.  I have call [name] MSW at hospital and she will follow up with [facility] and have them call her.

## 2015-10-16 DIAGNOSIS — Z0000 Encounter for general adult medical examination without abnormal findings: Secondary | ICD-10-CM | POA: Insufficient documentation

## 2015-10-22 ENCOUNTER — Encounter: Payer: Medicare Other | Attending: Physical Medicine & Rehabilitation | Admitting: Physical Medicine & Rehabilitation

## 2015-10-22 ENCOUNTER — Encounter: Payer: Self-pay | Admitting: Physical Medicine & Rehabilitation

## 2015-10-22 VITALS — BP 165/75 | HR 82 | Resp 14

## 2015-10-22 DIAGNOSIS — I10 Essential (primary) hypertension: Secondary | ICD-10-CM | POA: Diagnosis present

## 2015-10-22 DIAGNOSIS — R200 Anesthesia of skin: Secondary | ICD-10-CM | POA: Diagnosis not present

## 2015-10-22 DIAGNOSIS — R531 Weakness: Secondary | ICD-10-CM | POA: Insufficient documentation

## 2015-10-22 DIAGNOSIS — S14129D Central cord syndrome at unspecified level of cervical spinal cord, subsequent encounter: Secondary | ICD-10-CM | POA: Diagnosis not present

## 2015-10-22 DIAGNOSIS — S14129S Central cord syndrome at unspecified level of cervical spinal cord, sequela: Secondary | ICD-10-CM

## 2015-10-22 DIAGNOSIS — M792 Neuralgia and neuritis, unspecified: Secondary | ICD-10-CM

## 2015-10-22 DIAGNOSIS — Z85038 Personal history of other malignant neoplasm of large intestine: Secondary | ICD-10-CM | POA: Insufficient documentation

## 2015-10-22 DIAGNOSIS — X58XXXD Exposure to other specified factors, subsequent encounter: Secondary | ICD-10-CM | POA: Insufficient documentation

## 2015-10-22 DIAGNOSIS — Z9889 Other specified postprocedural states: Secondary | ICD-10-CM | POA: Insufficient documentation

## 2015-10-22 MED ORDER — METHOCARBAMOL 750 MG PO TABS: 750.0000 mg | ORAL_TABLET | Freq: Four times a day (QID) | ORAL | 2 refills | Status: DC | PRN

## 2015-10-22 NOTE — Progress Notes (Signed)
Subjective:    Patient ID: Terri Bradley, female    DOB: 03/19/1927, 82 y.o.   MRN: [id number]  HPI  82 y.o female with history of hypertension presents for hospital follow up after receiving CIR for central cord syndrome.   At discharge she was instructed to wear her collar at all times.  She saw Neurosurg yesterday and she was transitioned to soft collar.  She had blood work at her PCP.  Her burning pain has improved, to where it is in her hands only at present.  Bowel/bladder intact.  Overall, she is doing well.  Receiving therapy 2/week PT.  Daughter thinks pt may benefit from OT as well.  No problems swallowing.  Has 3 in 1 commode.  Does not require walker.  Ambulating without assistive device.   Pain Inventory Average Pain 5 Pain Right Now 5 My pain is constant, burning and tingling  In the last 24 hours, has pain interfered with the following? General activity 4 Relation with others 3 Enjoyment of life 10 What TIME of day is your pain at its worst? evening Sleep (in general) Poor  Pain is worse with: some activites Pain improves with: medication Relief from Meds: 5  Mobility walk without assistance how many minutes can you walk? 5 ability to climb steps?  yes do you drive?  yes Do you have any goals in this area?  no  Function retired I need assistance with the following:  meal prep, household duties and shopping  Neuro/Psych weakness numbness tingling  Prior Studies x-rays CT/MRI  Physicians involved in your care Primary care . Neurologist . Neurosurgeon .   Family History  Problem Relation Age of Onset  . Breast cancer Maternal Aunt    Social History   Social History  . Marital status: Widowed    Spouse name: N/A  . Number of children: N/A  . Years of education: N/A   Social History Main Topics  . Smoking status: Never Smoker  . Smokeless tobacco: Never Used  . Alcohol use No  . Drug use: Unknown  . Sexual activity: Not Asked    Other Topics Concern  . None   Social History Narrative   ** Merged History Encounter **       Past Surgical History:  Procedure Laterality Date  . ANTERIOR CERVICAL DECOMP/DISCECTOMY FUSION N/A 09/29/2015   Procedure: ANTERIOR CERVICAL DECOMPRESSION/DISCECTOMY CERVICAL SIX-SEVEN;  Surgeon: Eustace Moore, MD;  Location: [facility] [facility];  Service: Neurosurgery;  Laterality: N/A;  . BREAST BIOPSY Left 2013   core - neg  . CATARACT EXTRACTION    . colon tumor removal  2002   tumor removed "somewhere in colon, no treatment was ever needed."  . PARTIAL COLECTOMY Right 2010/002  . VAGINAL HYSTERECTOMY  1975   with oopherectomy   Past Medical History:  Diagnosis Date  . Colon cancer ([place]) 2002  . Colon cancer (HCC)--stage 1   . Diverticulosis   . HTN (hypertension)   . Hypertension    BP (!) 165/75 (BP Location: Right Arm, Patient Position: Sitting, Cuff Size: Normal)   Pulse 82   Resp 14   SpO2 94%   Opioid Risk Score:   Fall Risk Score:  `1  Depression screen PHQ 2/9  Depression screen PHQ 2/9 10/22/2015  Decreased Interest 3  Down, Depressed, Hopeless 0  PHQ - 2 Score 3  Altered sleeping 3  Tired, decreased energy 3  Change in appetite 2  Feeling bad or failure  about yourself  0  Trouble concentrating 0  Moving slowly or fidgety/restless 0  Suicidal thoughts 0  PHQ-9 Score 11  Difficult doing work/chores Not difficult at all    Review of Systems  HENT: Negative.   Eyes: Negative.   Respiratory: Positive for shortness of breath.   Cardiovascular: Negative.   Gastrointestinal: Negative.   Endocrine: Negative.   Genitourinary: Negative.   Musculoskeletal: Negative.   Skin: Negative.   Allergic/Immunologic: Negative.   Neurological: Positive for weakness and numbness.       Tingling  All other systems reviewed and are negative.      Objective:   Physical Exam Constitutional: She appears well-developed and well-nourished. NAD.  HENT: Normocephalic and  atraumatic. Soft collar in place. Cardiovascular: Normal rate and regular rhythm.   Respiratory: Effort normal and breath sounds normal. No stridor. No respiratory distress. She has no wheezes.  GI: Soft. Bowel sounds are normal. She exhibits no distension. There is no tenderness.  Musculoskeletal: She exhibits no edema.  Neurological: She is alert and oriented. Speech  clear.  Decreased sensation BUE.   Motor:  B/l UE Shoulder abduction, elbow flexion 5/5, wrist extension 4+/5, elbow extension 4/5, DI/APB 4-/5 B/l LE: Hip flexion 5/5, knee extension 5/5, ankle dorsi/plantar flexion 5/5 DTRs symmetric Skin: elbow lacs healing,  Psychiatric: She has a normal mood and affect. Her behavior is normal. Judgment and thought content normal    Assessment & Plan:  82 y.o female with history of hypertension presents for hospital follow up after receiving CIR for central cord syndrome.    1.  Central cord syndrome.  Transitioned to soft collar by Neurosurg, cont collar  Cont therapies  Cont follow up Neurosurg   2. Neuropathic pain  Cont Neurontin 100 qHS, pt believes this is adequate and beneficial   All questions answered.

## 2015-12-02 ENCOUNTER — Encounter: Payer: Medicare Other | Attending: Physical Medicine & Rehabilitation | Admitting: Physical Medicine & Rehabilitation

## 2015-12-02 ENCOUNTER — Encounter: Payer: Self-pay | Admitting: Physical Medicine & Rehabilitation

## 2015-12-02 VITALS — BP 187/75 | HR 61 | Resp 14

## 2015-12-02 DIAGNOSIS — S14129D Central cord syndrome at unspecified level of cervical spinal cord, subsequent encounter: Secondary | ICD-10-CM | POA: Insufficient documentation

## 2015-12-02 DIAGNOSIS — I10 Essential (primary) hypertension: Secondary | ICD-10-CM | POA: Diagnosis present

## 2015-12-02 DIAGNOSIS — R200 Anesthesia of skin: Secondary | ICD-10-CM | POA: Diagnosis not present

## 2015-12-02 DIAGNOSIS — S14109S Unspecified injury at unspecified level of cervical spinal cord, sequela: Secondary | ICD-10-CM | POA: Diagnosis not present

## 2015-12-02 DIAGNOSIS — R531 Weakness: Secondary | ICD-10-CM | POA: Insufficient documentation

## 2015-12-02 DIAGNOSIS — Z9889 Other specified postprocedural states: Secondary | ICD-10-CM | POA: Insufficient documentation

## 2015-12-02 DIAGNOSIS — Z85038 Personal history of other malignant neoplasm of large intestine: Secondary | ICD-10-CM | POA: Insufficient documentation

## 2015-12-02 NOTE — Progress Notes (Signed)
Subjective:    Patient ID: Terri Bradley, female    DOB: [birth year], 82 y.o.   MRN: [id number]  HPI   Perches is here in follow up of her central cord injury. She has completed HH therapies. She is out of her collar. She started at the [facility] for [employer]. She is doing some classes currently. She doesn't like the water.  She is only taking gabapentin at night for neuropathic pain. She doesn't take tylenol often at all. She states that the hands are "tingling" and sometimes it's uncomfortable but that the gabapentin is generally effective for pain control.  She still finds it difficult to perform some fine motor tasks due to the numbness and weakness in her hands.    Pain Inventory Average Pain 2 Pain Right Now 2 My pain is constant, burning and tingling  In the last 24 hours, has pain interfered with the following? General activity 2 Relation with others 0 Enjoyment of life 0 What TIME of day is your pain at its worst? evening Sleep (in general) Fair  Pain is worse with: inactivity Pain improves with: therapy/exercise Relief from Meds: 5  Mobility walk without assistance how many minutes can you walk? 10 ability to climb steps?  yes do you drive?  yes transfers alone Do you have any goals in this area?  yes  Function retired I need assistance with the following:  meal prep, household duties and shopping  Neuro/Psych weakness numbness tingling  Prior Studies Any changes since last visit?  no  Physicians involved in your care Any changes since last visit?  no   Family History  Problem Relation Age of Onset  . Breast cancer Maternal Aunt    Social History   Social History  . Marital status: Widowed    Spouse name: N/A  . Number of children: N/A  . Years of education: N/A   Social History Main Topics  . Smoking status: Never Smoker  . Smokeless tobacco: Never Used  . Alcohol use No  . Drug use: Unknown  . Sexual activity: Not Asked    Other Topics Concern  . None   Social History Narrative   ** Merged History Encounter **       Past Surgical History:  Procedure Laterality Date  . ANTERIOR CERVICAL DECOMP/DISCECTOMY FUSION N/A 09/29/2015   Procedure: ANTERIOR CERVICAL DECOMPRESSION/DISCECTOMY CERVICAL SIX-SEVEN;  Surgeon: Eustace Moore, MD;  Location: [place] [facility];  Service: Neurosurgery;  Laterality: N/A;  . BREAST BIOPSY Left 2013   core - neg  . CATARACT EXTRACTION    . colon tumor removal  2002   tumor removed "somewhere in colon, no treatment was ever needed."  . PARTIAL COLECTOMY Right 2010/002  . VAGINAL HYSTERECTOMY  1975   with oopherectomy   Past Medical History:  Diagnosis Date  . Colon cancer ([place]) 2002  . Colon cancer (HCC)--stage 1   . Diverticulosis   . HTN (hypertension)   . Hypertension    BP (!) 190/73   Pulse 61   Resp 14   SpO2 95%   Opioid Risk Score:   Fall Risk Score:  `1  Depression screen PHQ 2/9  Depression screen PHQ 2/9 10/22/2015  Decreased Interest 3  Down, Depressed, Hopeless 0  PHQ - 2 Score 3  Altered sleeping 3  Tired, decreased energy 3  Change in appetite 2  Feeling bad or failure about yourself  0  Trouble concentrating 0  Moving slowly or fidgety/restless 0  Suicidal thoughts 0  PHQ-9 Score 11  Difficult doing work/chores Not difficult at all     Review of Systems  Constitutional: Positive for unexpected weight change.  Respiratory: Positive for shortness of breath.   All other systems reviewed and are negative.      Objective:   Physical Exam Constitutional: She appears well-developed and well-nourished. NAD.  HENT: Normocephalic and atraumatic. Soft collar in place. Cardiovascular: Normal rate and regular rhythm.  Respiratory: Effort normal and breath sounds normal. No stridor. No respiratory distress. She has no wheezes.  GI: Soft. Bowel sounds are normal. She exhibits no distension. There is no tenderness.  Musculoskeletal: She exhibits  no edema.  Neurological: She is alert and oriented. Speech clear.  Decreased sensation BUE in hands only. Good FMC/alternating hand movements.  Motor:  B/l UE Shoulder abduction, elbow flexion 5/5, wrist extension4+/5, elbow extension 4/5, DI/APB 4/5 B/l LE: Hip flexion 5/5, knee extension 5/5, ankle dorsi/plantar flexion 5/5 DTRs symmetric Skin: intact  Psychiatric: She has a normal mood and affect. Her behavior is normal. Judgment and thought content normal    Assessment & Plan:  82 y.o femalewith history of hypertension presents for hospital follow up after receiving CIR for central cord syndrome.    1. Central cord syndrome.             -making nice progress             -silver sneakers---really doesn't need formal OT at this point  -has exercises for home already 2. Neuropathic pain             Cont Neurontin 100 qHS, pt feels that this helping and doesn't want to take more  -tylenol prn for flares  Fifteen minutes of face to face patient care time were spent during this visit. All questions were encouraged and answered.  See me PRN

## 2015-12-02 NOTE — Patient Instructions (Addendum)
PLEASE CALL ME WITH ANY PROBLEMS OR QUESTIONS [id number])   USE TYLENOL FOR ANY FLARES IN YOUR PAIN

## 2016-01-02 ENCOUNTER — Other Ambulatory Visit: Payer: Self-pay | Admitting: Internal Medicine

## 2016-01-02 DIAGNOSIS — Z1231 Encounter for screening mammogram for malignant neoplasm of breast: Secondary | ICD-10-CM

## 2016-02-10 ENCOUNTER — Ambulatory Visit
Admission: RE | Admit: 2016-02-10 | Discharge: 2016-02-10 | Disposition: A | Discharge status: Home / Self Care | Payer: Medicare Other | Source: Ambulatory Visit | Attending: Internal Medicine | Admitting: Internal Medicine

## 2016-02-10 DIAGNOSIS — Z1231 Encounter for screening mammogram for malignant neoplasm of breast: Secondary | ICD-10-CM | POA: Diagnosis present

## 2016-10-26 DIAGNOSIS — E538 Deficiency of other specified B group vitamins: Secondary | ICD-10-CM | POA: Insufficient documentation

## 2016-10-26 DIAGNOSIS — Z85038 Personal history of other malignant neoplasm of large intestine: Secondary | ICD-10-CM | POA: Insufficient documentation

## 2016-10-26 DIAGNOSIS — Z8619 Personal history of other infectious and parasitic diseases: Secondary | ICD-10-CM | POA: Insufficient documentation

## 2020-06-30 ENCOUNTER — Other Ambulatory Visit: Payer: Self-pay

## 2020-06-30 ENCOUNTER — Emergency Department
Admission: EM | Admit: 2020-06-30 | Discharge: 2020-06-30 | Disposition: A | Discharge status: Home / Self Care | Payer: Medicare PPO | Attending: Emergency Medicine | Admitting: Emergency Medicine

## 2020-06-30 ENCOUNTER — Emergency Department: Payer: Medicare PPO

## 2020-06-30 DIAGNOSIS — Z20822 Contact with and (suspected) exposure to covid-19: Secondary | ICD-10-CM | POA: Insufficient documentation

## 2020-06-30 DIAGNOSIS — I10 Essential (primary) hypertension: Secondary | ICD-10-CM | POA: Insufficient documentation

## 2020-06-30 DIAGNOSIS — R5383 Other fatigue: Secondary | ICD-10-CM | POA: Insufficient documentation

## 2020-06-30 DIAGNOSIS — Z85038 Personal history of other malignant neoplasm of large intestine: Secondary | ICD-10-CM | POA: Diagnosis not present

## 2020-06-30 DIAGNOSIS — R42 Dizziness and giddiness: Secondary | ICD-10-CM | POA: Insufficient documentation

## 2020-06-30 DIAGNOSIS — R000 Tachycardia, unspecified: Secondary | ICD-10-CM | POA: Diagnosis not present

## 2020-06-30 DIAGNOSIS — R531 Weakness: Secondary | ICD-10-CM

## 2020-06-30 DIAGNOSIS — Z79899 Other long term (current) drug therapy: Secondary | ICD-10-CM | POA: Insufficient documentation

## 2020-06-30 DIAGNOSIS — N3000 Acute cystitis without hematuria: Secondary | ICD-10-CM | POA: Diagnosis not present

## 2020-06-30 LAB — TROPONIN I (HIGH SENSITIVITY)
Troponin I (High Sensitivity): 5 ng/L (ref <18)
Troponin I (High Sensitivity): 5 ng/L (ref <18)

## 2020-06-30 LAB — CBC
HCT: 40.9 % (ref 36.0–46.0)
Hemoglobin: 13.9 g/dL (ref 12.0–15.0)
MCH: 30.4 pg (ref 26.0–34.0)
MCHC: 34 g/dL (ref 30.0–36.0)
MCV: 89.5 fL (ref 80.0–100.0)
Platelets: 350 10*3/uL (ref 150–400)
RBC: 4.57 MIL/uL (ref 3.87–5.11)
RDW: 13.2 % (ref 11.5–15.5)
WBC: 10.5 10*3/uL (ref 4.0–10.5)
nRBC: 0 % (ref 0.0–0.2)

## 2020-06-30 LAB — BASIC METABOLIC PANEL
Anion gap: 10 (ref 5–15)
BUN: 30 mg/dL — ABNORMAL HIGH (ref 8–23)
CO2: 26 mmol/L (ref 22–32)
Calcium: 9.7 mg/dL (ref 8.9–10.3)
Chloride: 102 mmol/L (ref 98–111)
Creatinine, Ser: 0.78 mg/dL (ref 0.44–1.00)
GFR, Estimated: >60 mL/min (ref >60)
Glucose, Bld: 116 mg/dL — ABNORMAL HIGH (ref 70–99)
Potassium: 4 mmol/L (ref 3.5–5.1)
Sodium: 138 mmol/L (ref 135–145)

## 2020-06-30 LAB — URINALYSIS, COMPLETE (UACMP) WITH MICROSCOPIC
Bilirubin Urine: NEGATIVE
Glucose, UA: NEGATIVE mg/dL
Hgb urine dipstick: NEGATIVE
Ketones, ur: NEGATIVE mg/dL
Nitrite: NEGATIVE
Protein, ur: NEGATIVE mg/dL
Specific Gravity, Urine: 1.025 (ref 1.005–1.030)
pH: 5 (ref 5.0–8.0)

## 2020-06-30 LAB — HEPATIC FUNCTION PANEL
ALT: 13 U/L (ref 0–44)
AST: 20 U/L (ref 15–41)
Albumin: 4.3 g/dL (ref 3.5–5.0)
Alkaline Phosphatase: 68 U/L (ref 38–126)
Bilirubin, Direct: <0.1 mg/dL (ref 0.0–0.2)
Total Bilirubin: 0.6 mg/dL (ref 0.3–1.2)
Total Protein: 7.7 g/dL (ref 6.5–8.1)

## 2020-06-30 LAB — BRAIN NATRIURETIC PEPTIDE: B Natriuretic Peptide: 42.4 pg/mL (ref 0.0–100.0)

## 2020-06-30 LAB — RESP PANEL BY RT-PCR (FLU A&B, COVID) ARPGX2
Influenza A by PCR: NEGATIVE
Influenza B by PCR: NEGATIVE
SARS Coronavirus 2 by RT PCR: NEGATIVE

## 2020-06-30 LAB — PROCALCITONIN: Procalcitonin: <0.1 ng/mL

## 2020-06-30 LAB — D-DIMER, QUANTITATIVE: D-Dimer, Quant: 2.15 ug/mL-FEU — ABNORMAL HIGH (ref 0.00–0.50)

## 2020-06-30 LAB — MAGNESIUM: Magnesium: 2.1 mg/dL (ref 1.7–2.4)

## 2020-06-30 MED ORDER — CEPHALEXIN 500 MG PO CAPS: 500.0000 mg | ORAL_CAPSULE | Freq: Four times a day (QID) | ORAL | 0 refills | Status: AC

## 2020-06-30 MED ORDER — CEPHALEXIN 500 MG PO CAPS
500.0000 mg | ORAL_CAPSULE | Freq: Once | ORAL | Status: AC
  Administered 2020-06-30: 500 mg via ORAL
  Filled 2020-06-30: qty 1

## 2020-06-30 MED ORDER — CEPHALEXIN 500 MG PO CAPS: 500.0000 mg | ORAL_CAPSULE | Freq: Four times a day (QID) | ORAL | 0 refills | Status: DC

## 2020-06-30 MED ORDER — IOHEXOL 350 MG/ML SOLN
75.0000 mL | Freq: Once | INTRAVENOUS | Status: AC | PRN
  Administered 2020-06-30: 75 mL via INTRAVENOUS

## 2020-06-30 NOTE — ED Notes (Signed)
Patient transported to CT 

## 2020-06-30 NOTE — ED Triage Notes (Signed)
Pt come with c/o fatigue, nausea and HTN. Pt states few days ago she blacked out.  Pt states she is hungry but doesn't want to eat. Pt states she had her BP checked this weekend and it was elevated.

## 2020-06-30 NOTE — ED Provider Notes (Signed)
[facility] Emergency Department Provider Note  ____________________________________________   Event Date/Time   First MD Initiated Contact with Patient 06/30/20 1504     (approximate)  I have reviewed the triage vital signs and the nursing notes.   HISTORY  Chief Complaint Weakness   HPI Terri Bradley is a 87 y.o. female past medical history of colon cancer, HTN, diverticulosis, neurogenic bladder who presents accompanied by daughter for assessment approximately 1 week of some fatigue and lightheadedness on standing.  Patient states it all started a week ago when she had a syncopal episode on 5/24 when she was at [employer] at the gym and missed a chair sitting down.  She thinks she passed out immediately before falling.  However she does not think she hit her head and has not had any subsequent pain including any headaches, neck pain, earache, sore throat, abdominal pain, back pain, extremity pain, rash or any other associated sick symptoms.  She denies any cough, fevers, vomiting, diarrhea, urinary symptoms or vertigo.  She states she has not had much of an appetite over the last couple days and does not think he has been drinking of water and feels very rundown but denies any other clear associated sick symptoms.  Denies any recent medication changes.  She has been taking all as directed.         Past Medical History:  Diagnosis Date  . Colon cancer ([place]) 2002  . Colon cancer (HCC)--stage 1   . Diverticulosis   . HTN (hypertension)   . Hypertension     Patient Active Problem List   Diagnosis Date Noted  . Central cord syndrome at C6 level of cervical spinal cord ([place]) 10/01/2015  . Neurologic gait disorder   . Neuropathic pain   . Post-operative pain   . Neurogenic bladder   . Neurogenic bowel   . Cervical vertebral fusion 09/29/2015  . Spinal cord injury at C5-C7 level without injury of spinal bone ([place])   . Benign essential HTN   .  Acute neck pain   . Leukocytosis   . Contusion of cervical cord ([place]) 09/26/2015  . Central cord syndrome [facility]) 09/26/2015    Past Surgical History:  Procedure Laterality Date  . ANTERIOR CERVICAL DECOMP/DISCECTOMY FUSION N/A 09/29/2015   Procedure: ANTERIOR CERVICAL DECOMPRESSION/DISCECTOMY CERVICAL SIX-SEVEN;  Surgeon: Eustace Moore, MD;  Location: [place] [facility];  Service: Neurosurgery;  Laterality: N/A;  . BREAST BIOPSY Left 2013   core - neg  . CATARACT EXTRACTION    . colon tumor removal  2002   tumor removed "somewhere in colon, no treatment was ever needed."  . PARTIAL COLECTOMY Right 2010/002  . VAGINAL HYSTERECTOMY  1975   with oopherectomy    Prior to Admission medications   Medication Sig Start Date End Date Taking? Authorizing Provider  cephALEXin (KEFLEX) 500 MG capsule Take 1 capsule (500 mg total) by mouth 4 (four) times daily for 7 days. 06/30/20 07/07/20 Yes Lucrezia Starch, MD  acetaminophen (TYLENOL) 325 MG tablet Take 1-2 tablets (325-650 mg total) by mouth every 4 (four) hours as needed for mild pain. 10/08/15   Love, Ivan Anchors, PA-C  calcium carbonate (OSCAL) 1500 (600 Ca) MG TABS tablet Take 1,500 mg by mouth 2 (two) times daily with a meal.    [provider]  Cholecalciferol (VITAMIN D) 2000 units tablet Take 2,000 Units by mouth daily.    [provider]  gabapentin (NEURONTIN) 100 MG capsule Take  1 capsule (100 mg total) by mouth at bedtime. 10/08/15   Love, Ivan Anchors, PA-C  lisinopril-hydrochlorothiazide (PRINZIDE,ZESTORETIC) 20-25 MG tablet Take 1 tablet by mouth daily.    [provider]  Multiple Vitamin (MULTIVITAMIN WITH MINERALS) TABS tablet Take 1 tablet by mouth every evening.    [provider]  senna (SENOKOT) 8.6 MG TABS tablet Take 1 tablet (8.6 mg total) by mouth at bedtime. 10/08/15   Love, Ivan Anchors, PA-C  traZODone (DESYREL) 50 MG tablet Take 0.5-1 tablets (25-50 mg total) by mouth at bedtime as needed for sleep. 10/08/15    Love, Ivan Anchors, PA-C  vitamin B-12 (CYANOCOBALAMIN) 500 MCG tablet Take 500 mcg by mouth daily.    [provider]    Allergies Patient has no known allergies.  Family History  Problem Relation Age of Onset  . Breast cancer Maternal Aunt   . Breast cancer Other   . Breast cancer Other     Social History Social History   Tobacco Use  . Smoking status: Never Smoker  . Smokeless tobacco: Never Used  Substance Use Topics  . Alcohol use: No    Review of Systems  Review of Systems  Constitutional: Positive for malaise/fatigue. Negative for chills and fever.  HENT: Negative for sore throat.   Eyes: Negative for pain.  Respiratory: Negative for cough and stridor.   Cardiovascular: Negative for chest pain.  Gastrointestinal: Negative for vomiting.  Genitourinary: Negative for dysuria.  Musculoskeletal: Negative for myalgias.  Skin: Negative for rash.  Neurological: Positive for dizziness and loss of consciousness. Negative for seizures and headaches.  Psychiatric/Behavioral: Negative for suicidal ideas.  All other systems reviewed and are negative.     ____________________________________________   PHYSICAL EXAM:  VITAL SIGNS: ED Triage Vitals  Enc Vitals Group     BP 06/30/20 1500 (!) 160/81     Pulse Rate 06/30/20 1500 (!) 101     Resp 06/30/20 1500 16     Temp 06/30/20 1500 98.5 F (36.9 C)     Temp Source 06/30/20 1500 Oral     SpO2 06/30/20 1500 96 %     Weight 06/30/20 1501 173 lb (78.5 kg)     Height 06/30/20 1501 5\' 9"  (1.753 m)     Head Circumference --      Peak Flow --      Pain Score 06/30/20 1458 0     Pain Loc --      Pain Edu? --      Excl. in [place]? --    Vitals:   06/30/20 1815 06/30/20 1819  BP: (!) 162/70 140/69  Pulse: 75 100  Resp:  20  Temp:    SpO2: 97%    Physical Exam Vitals and nursing note reviewed.  Constitutional:      General: She is not in acute distress.    Appearance: She is well-developed.  HENT:     Head:  Normocephalic and atraumatic.     Right Ear: External ear normal.     Left Ear: External ear normal.     Nose: Nose normal.  Eyes:     Conjunctiva/sclera: Conjunctivae normal.  Cardiovascular:     Rate and Rhythm: Normal rate and regular rhythm.     Heart sounds: No murmur heard.   Pulmonary:     Effort: Pulmonary effort is normal. No respiratory distress.     Breath sounds: Normal breath sounds.  Abdominal:     Palpations: Abdomen is soft.  Tenderness: There is no abdominal tenderness.  Musculoskeletal:     Cervical back: Neck supple.  Skin:    General: Skin is warm and dry.     Capillary Refill: Capillary refill takes less than 2 seconds.  Neurological:     Mental Status: She is alert and oriented to person, place, and time.  Psychiatric:        Mood and Affect: Mood normal.     Cranial nerves II through XII grossly intact.  No pronator drift.  No finger dysmetria.  Symmetric 5/5 strength of all extremities.  Sensation intact to light touch in all extremities.  Unremarkable unassisted gait.  2+ radial pulses.  No tenderness step-offs or deformities over the C/T/L-spine.  No obvious trauma to the bilateral upper or lower extremities. ____________________________________________   LABS (all labs ordered are listed, but only abnormal results are displayed)  Labs Reviewed  BASIC METABOLIC PANEL - Abnormal; Notable for the following components:      Result Value   Glucose, Bld 116 (*)    BUN 30 (*)    All other components within normal limits  URINALYSIS, COMPLETE (UACMP) WITH MICROSCOPIC - Abnormal; Notable for the following components:   Color, Urine YELLOW (*)    APPearance CLOUDY (*)    Leukocytes,Ua SMALL (*)    Bacteria, UA MANY (*)    All other components within normal limits  D-DIMER, QUANTITATIVE (NOT AT [facility]) - Abnormal; Notable for the following components:   D-Dimer, Quant 2.15 (*)    All other components within normal limits  URINE CULTURE  RESP PANEL  BY RT-PCR (FLU A&B, COVID) ARPGX2  CBC  PROCALCITONIN  MAGNESIUM  BRAIN NATRIURETIC PEPTIDE  HEPATIC FUNCTION PANEL  CBG MONITORING, ED  TROPONIN I (HIGH SENSITIVITY)  TROPONIN I (HIGH SENSITIVITY)   ____________________________________________  EKG  Sinus rhythm with a ventricular rate of 89, normal axis, nonspecific ST change in V5 versus artifact and an aVF with Q waves in lead III.  No other clear evidence of acute ischemia or other significant Rhythm.  ____________________________________________  RADIOLOGY  ED MD interpretation: Chest x-ray without focal consolidation, overt edema, pneumothorax, effusion or any other clear acute intrathoracic process.  CTA chest without evidence of PE or other clear acute intrathoracic process including pneumonia, effusion, edema, dissection, or other clear acute emergent process.  Official radiology report(s): DG Chest 2 View  Result Date: 06/30/2020 CLINICAL DATA:  Lightheaded, tachycardic EXAM: CHEST - 2 VIEW COMPARISON:  CT chest 09/26/2015 FINDINGS: The heart size and mediastinal contours are within normal limits. Both lungs are clear. The visualized skeletal structures are unremarkable. IMPRESSION: No active cardiopulmonary disease. Electronically Signed   By: Kathreen Devoid   On: 06/30/2020 16:19   CT Angio Chest PE W and/or Wo Contrast  Result Date: 06/30/2020 CLINICAL DATA:  87 year old female with concern for pulmonary embolism. EXAM: CT ANGIOGRAPHY CHEST WITH CONTRAST TECHNIQUE: Multidetector CT imaging of the chest was performed using the standard protocol during bolus administration of intravenous contrast. Multiplanar CT image reconstructions and MIPs were obtained to evaluate the vascular anatomy. CONTRAST:  7mL OMNIPAQUE IOHEXOL 350 MG/ML SOLN COMPARISON:  Chest CT dated 09/26/2015 and radiograph dated 06/30/2020. FINDINGS: Cardiovascular: There is mild cardiomegaly. No pericardial effusion. There is coronary vascular calcification.  Mild atherosclerotic calcification of the thoracic aorta. No aneurysmal dilatation. Evaluation of the pulmonary arteries is limited due to respiratory motion artifact. No pulmonary artery embolus identified. Faint apparent non opacification of a subsegmental right upper lobe pulmonary artery  branch (1 1/5) likely artifactual. Mediastinum/Nodes: No hilar or mediastinal adenopathy. The esophagus is grossly unremarkable. No mediastinal fluid collection. Lungs/Pleura: No focal consolidation, pleural effusion, or pneumothorax. The central airways are patent. Upper Abdomen: No acute abnormality. Colonic diverticulosis. Musculoskeletal: Degenerative changes of the spine. Partially visualized lower cervical ACDF. No acute osseous pathology. Review of the MIP images confirms the above findings. IMPRESSION: No acute intrathoracic pathology. No CT evidence of pulmonary embolism. Electronically Signed   By: Anner Crete M.D.   On: 06/30/2020 17:14    ____________________________________________   PROCEDURES  Procedure(s) performed (including Critical Care):  .1-3 Lead EKG Interpretation Performed by: Lucrezia Starch, MD Authorized by: Lucrezia Starch, MD     Interpretation: normal     ECG rate assessment: normal     Rhythm: sinus rhythm     Ectopy: none     Conduction: normal       ____________________________________________   INITIAL IMPRESSION / ASSESSMENT AND PLAN / ED COURSE      Patient presents with above to history exam for approximately 1 week of lightheadedness on standing and some fatigue all symptoms began with a syncopal episode about a week ago.  On arrival she is hypertensive with a BP of 160/81 and slight tachycardic at 101 with otherwise stable vital signs on room air.  She denies any other associated sick symptoms.  She has no obvious trauma on exam and nonfocal supine neuro exam.  Again her exam is unremarkable.  Primary differential includes symptomatic arrhythmia,  orthostatic lightheadedness, dehydration, metabolic derangement, acute infectious process i.e. urinary tract infection, heart failure, PE, and ACS.  Chest x-ray without focal consolidation, overt edema, pneumothorax, effusion or any other clear acute intrathoracic process.  CTA chest obtained due to elevated D-dimer without evidence of PE or other clear acute intrathoracic process including pneumonia, effusion, edema, dissection, or other clear acute emergent process.  ECG with some nonspecific findings but given nonelevated troponin x2 Evalose patient for ACS or myocarditis.  BMP shows no significant electrode or metabolic derangements.  CBC without leukocytosis or acute anemia.  Magnesium hepatic function panel BMP are unremarkable.  UA is concerning for possible cystitis with small leukocyte esterase, 6-10 WBCs and many bacteria.  We will send urine culture and treat with a short course of Keflex.  On obtaining orthostatic blood pressures patient did have a drop for systolic from 1 92-4 40 although she adamantly denied any symptoms at this time and does not develop any tachycardia or change in her diastolic blood pressure suspect she may be slightly dehydrated although she was given several cups of juice and was able to tolerate this without any difficulty.  She states she will be much more intentional about drinking adequate water durably she requires 80 fluid resuscitation at this time.  Given stable vitals otherwise reassuring exam work-up and patient denying any symptoms and several reassessments I think she is safe for discharge back close outpatient follow-up.  Discharged stable condition peer strict return precautions advised and discussed.  We will send COVID PCR as well advised patient she can follow-up these results online.      ____________________________________________   FINAL CLINICAL IMPRESSION(S) / ED DIAGNOSES  Final diagnoses:  Acute cystitis without hematuria  Generalized  weakness  Lightheaded    Medications  cephALEXin (KEFLEX) capsule 500 mg (has no administration in time range)  iohexol (OMNIPAQUE) 350 MG/ML injection 75 mL (75 mLs Intravenous Contrast Given 06/30/20 1655)     ED Discharge Orders  Ordered    cephALEXin (KEFLEX) 500 MG capsule  4 times daily        06/30/20 1837           Note:  This document was prepared using Dragon voice recognition software and may include unintentional dictation errors.   Lucrezia Starch, MD 06/30/20 [phone]

## 2020-07-02 LAB — URINE CULTURE

## 2020-11-10 DIAGNOSIS — F330 Major depressive disorder, recurrent, mild: Secondary | ICD-10-CM | POA: Insufficient documentation

## 2021-09-09 ENCOUNTER — Encounter: Payer: Self-pay | Admitting: Podiatry

## 2021-09-09 ENCOUNTER — Ambulatory Visit: Payer: Medicare PPO | Admitting: Podiatry

## 2021-09-09 DIAGNOSIS — B351 Tinea unguium: Secondary | ICD-10-CM | POA: Diagnosis not present

## 2021-09-09 DIAGNOSIS — M79676 Pain in unspecified toe(s): Secondary | ICD-10-CM

## 2021-09-09 NOTE — Progress Notes (Signed)
Subjective:  Patient ID: Terri Bradley, female    DOB: Jul 10, 1927,  MRN: [id number] HPI Chief Complaint  Patient presents with   Nail Problem    Requesting nail trim - unable to do herself   New Patient (Initial Visit)    88 y.o. female presents with the above complaint.   ROS: She denies fever chills nausea vomiting muscle aches pains calf pain back pain chest pain shortness of breath.  Past Medical History:  Diagnosis Date   Colon cancer ([place]) 2002   Colon cancer (HCC)--stage 1    Diverticulosis    HTN (hypertension)    Hypertension    Past Surgical History:  Procedure Laterality Date   ANTERIOR CERVICAL DECOMP/DISCECTOMY FUSION N/A 09/29/2015   Procedure: ANTERIOR CERVICAL DECOMPRESSION/DISCECTOMY CERVICAL SIX-SEVEN;  Surgeon: Eustace Moore, MD;  Location: [place] [facility];  Service: Neurosurgery;  Laterality: N/A;   BREAST BIOPSY Left 2013   core - neg   CATARACT EXTRACTION     colon tumor removal  2002   tumor removed "somewhere in colon, no treatment was ever needed."   PARTIAL COLECTOMY Right 2010/002   VAGINAL HYSTERECTOMY  1975   with oopherectomy    Current Outpatient Medications:    acetaminophen (TYLENOL) 325 MG tablet, Take 1-2 tablets (325-650 mg total) by mouth every 4 (four) hours as needed for mild pain., Disp: , Rfl:    calcium carbonate (OSCAL) 1500 (600 Ca) MG TABS tablet, Take 1,500 mg by mouth 2 (two) times daily with a meal., Disp: , Rfl:    Cholecalciferol (VITAMIN D) 2000 units tablet, Take 2,000 Units by mouth daily., Disp: , Rfl:    furosemide (LASIX) 20 MG tablet, Take 20 mg by mouth daily as needed., Disp: , Rfl:    gabapentin (NEURONTIN) 100 MG capsule, Take 1 capsule (100 mg total) by mouth at bedtime., Disp: 30 capsule, Rfl: 0   lisinopril-hydrochlorothiazide (PRINZIDE,ZESTORETIC) 20-25 MG tablet, Take 1 tablet by mouth daily., Disp: , Rfl:    Multiple Vitamin (MULTIVITAMIN WITH MINERALS) TABS tablet, Take 1 tablet by mouth every evening.,  Disp: , Rfl:    senna (SENOKOT) 8.6 MG TABS tablet, Take 1 tablet (8.6 mg total) by mouth at bedtime., Disp: 120 each, Rfl: 0   sertraline (ZOLOFT) 25 MG tablet, Take 25 mg by mouth daily., Disp: , Rfl:    traZODone (DESYREL) 50 MG tablet, Take 0.5-1 tablets (25-50 mg total) by mouth at bedtime as needed for sleep., Disp: 30 tablet, Rfl: 0   vitamin B-12 (CYANOCOBALAMIN) 500 MCG tablet, Take 500 mcg by mouth daily., Disp: , Rfl:   No Known Allergies Review of Systems Objective:  There were no vitals filed for this visit.  General: Well developed, nourished, in no acute distress, alert and oriented x3   Dermatological: Skin is warm, dry and supple bilateral. Nails x 10 are well maintained; slightly elongated and thickened tender on palpation.  Remaining integument appears unremarkable at this time. There are no open sores, no preulcerative lesions, no rash or signs of infection present.  Vascular: Dorsalis Pedis artery and Posterior Tibial artery pedal pulses are 2/4 bilateral with immedate capillary fill time. Pedal hair growth present. No varicosities and no lower extremity edema present bilateral.   Neruologic: Grossly intact via light touch bilateral. Vibratory intact via tuning fork bilateral. Protective threshold with Semmes Wienstein monofilament intact to all pedal sites bilateral. Patellar and Achilles deep tendon reflexes 2+ bilateral. No Babinski or clonus noted bilateral.   Musculoskeletal: No gross  boney pedal deformities bilateral. No pain, crepitus, or limitation noted with foot and ankle range of motion bilateral. Muscular strength 5/5 in all groups tested bilateral.  Gait: Unassisted, Nonantalgic.    Radiographs:  None taken none taken  Assessment & Plan:   Assessment: Pain in limb secondary to onychomycosis.  Plan: Debridement of toenails 1 through 5 bilateral.     [place], [place]

## 2021-12-16 ENCOUNTER — Encounter: Payer: Self-pay | Admitting: Podiatry

## 2021-12-16 ENCOUNTER — Ambulatory Visit: Payer: Medicare PPO | Admitting: Podiatry

## 2021-12-16 DIAGNOSIS — B351 Tinea unguium: Secondary | ICD-10-CM | POA: Diagnosis not present

## 2021-12-16 DIAGNOSIS — M79676 Pain in unspecified toe(s): Secondary | ICD-10-CM | POA: Diagnosis not present

## 2021-12-16 NOTE — Progress Notes (Signed)
She presents today chief complaint of painful elongated toenails 1 through 5 bilaterally.  Objective: Toenails are long thick yellow dystrophic with mycotic pulses are strong and palpable.  There is no erythema edema salines drainage or odor.  Assessment: Pain in limb secondary to onychomycosis.  Plan: Debridement of toenails 1 through 5 bilateral.

## 2022-03-14 IMAGING — CT CT ANGIO CHEST
2 of 6 series · 18 of 46 positions shown · IV contrast (omnipaque)
Comparison: Chest CT dated 09/26/2015 and radiograph dated
06/30/2020.

CLINICAL DATA: [AGE] female with concern for pulmonary
embolism.

EXAM:
CT ANGIOGRAPHY CHEST WITH CONTRAST
TECHNIQUE: Multidetector CT imaging of the chest was performed using the
standard protocol during bolus administration of intravenous
contrast. Multiplanar CT image reconstructions and MIPs were
obtained to evaluate the vascular anatomy.
CONTRAST:  75mL OMNIPAQUE IOHEXOL 350 MG/ML SOLN

[Series 5: thins · axial · 0.66mm/px · z∈[+882,+1129]mm · 15 of 338 slices shown]
[im 15/338  lung]
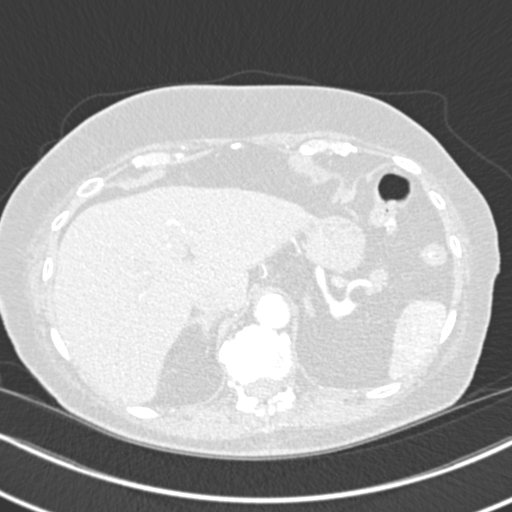
[im 43/338  soft-tissue]
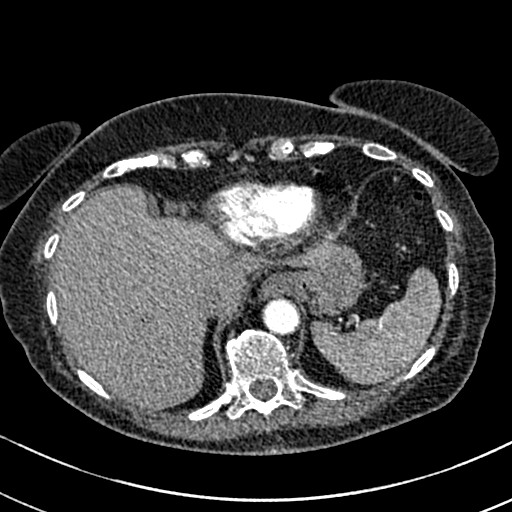
[im 57/338  lung]
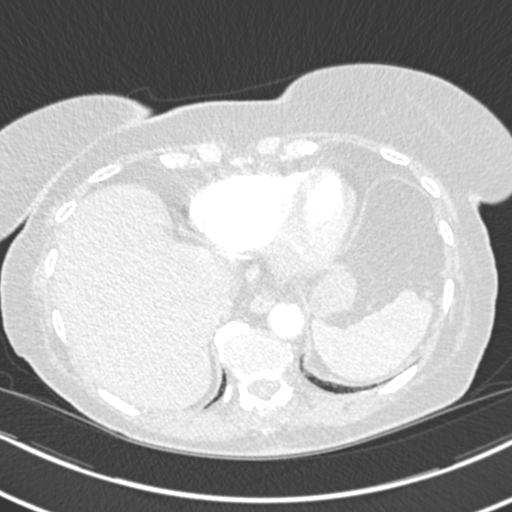
[im 85/338  soft-tissue]
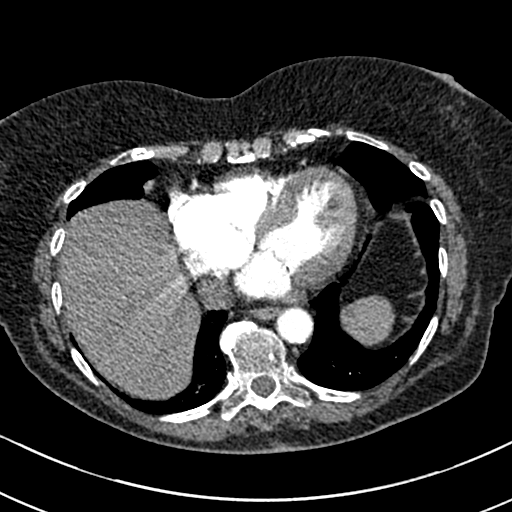
[im 99/338  lung]
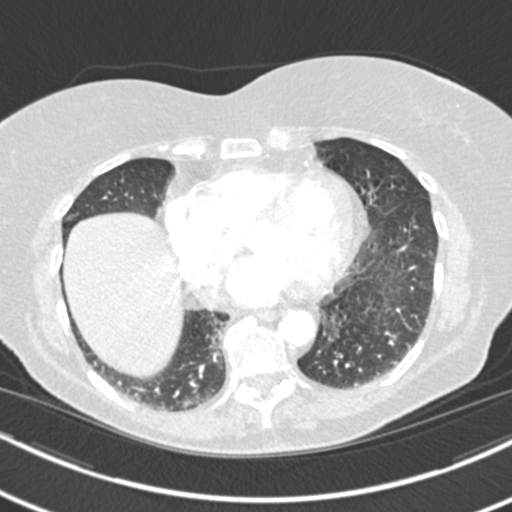
[im 127/338  soft-tissue]
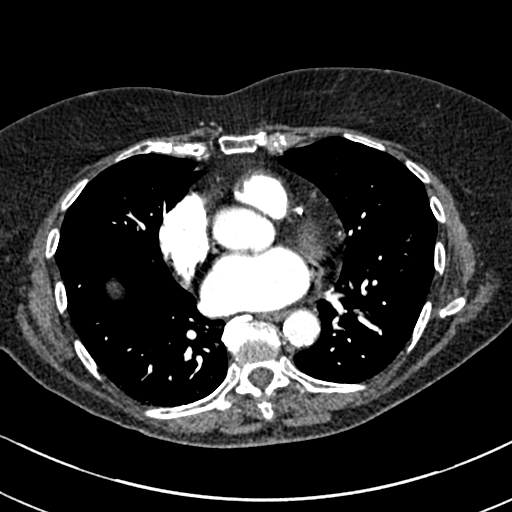
[im 141/338  lung]
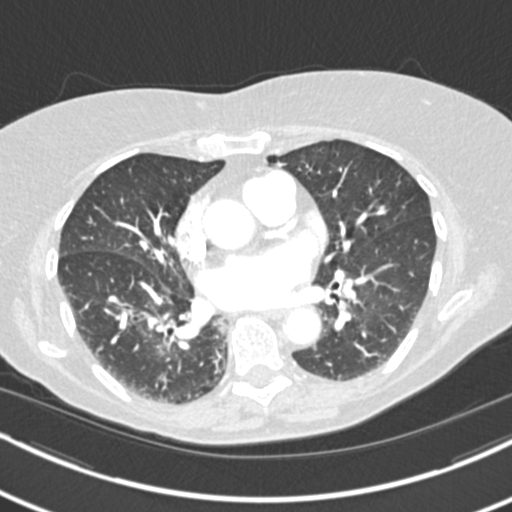
[im 169/338  soft-tissue]
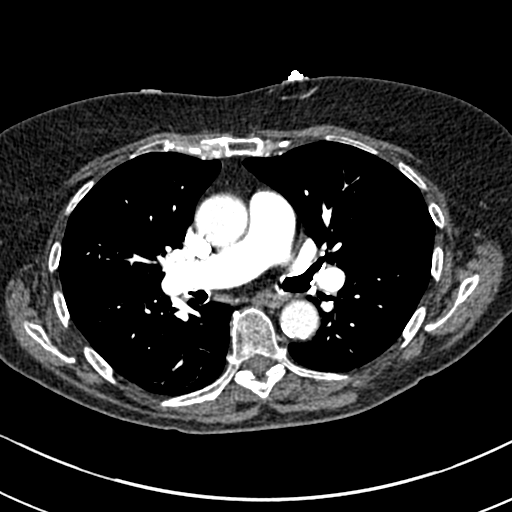
[im 197/338  lung]
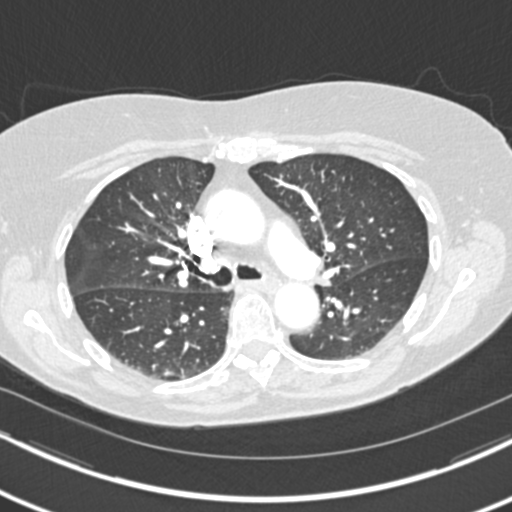
[im 211/338  soft-tissue]
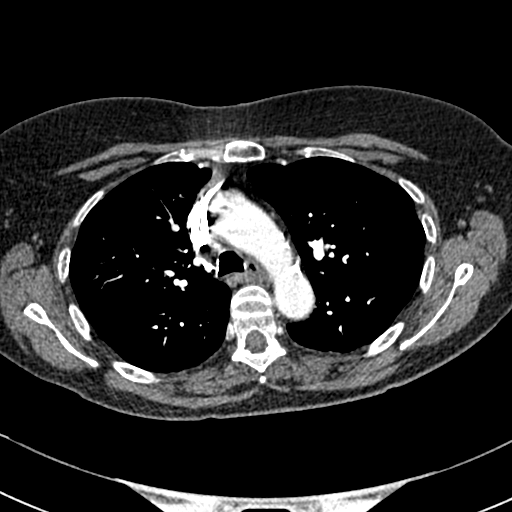
[im 239/338  lung]
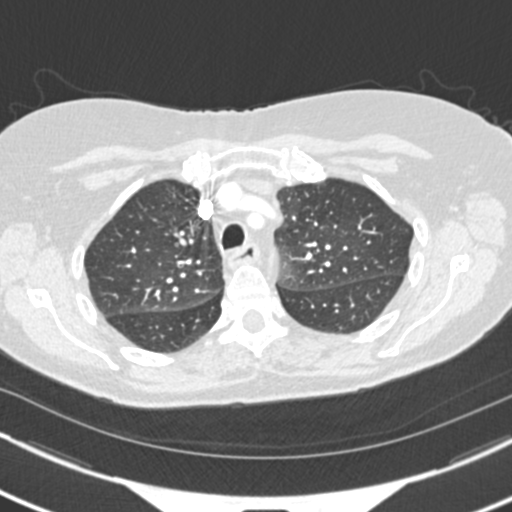
[im 253/338  soft-tissue]
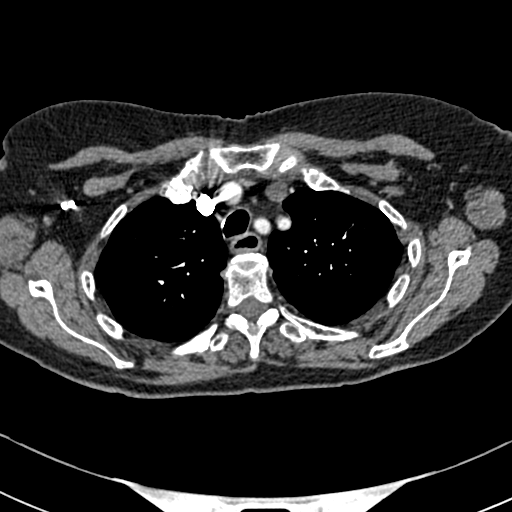
[im 281/338  lung]
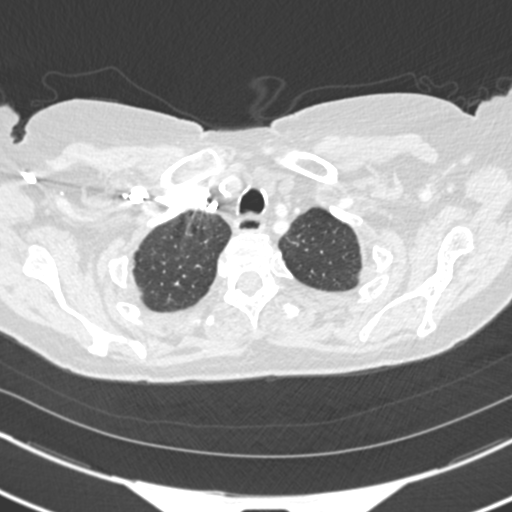
[im 295/338  soft-tissue]
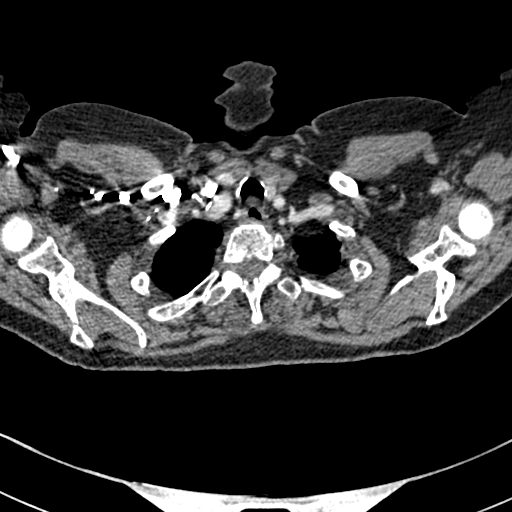
[im 323/338  lung]
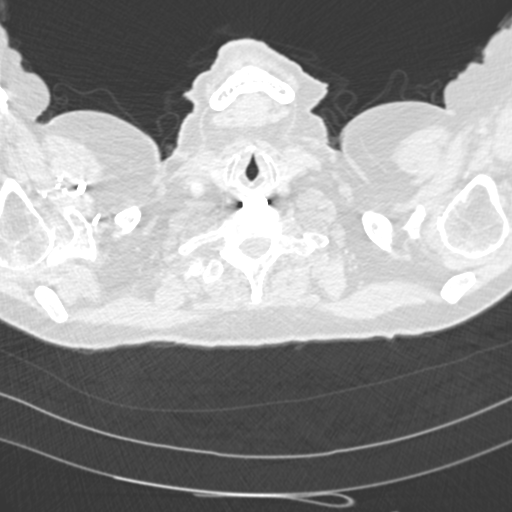

[Series 7: coronal mpr · coronal · 0.54mm/px · 3 of 90 slices shown]
[im 23/90  soft-tissue]
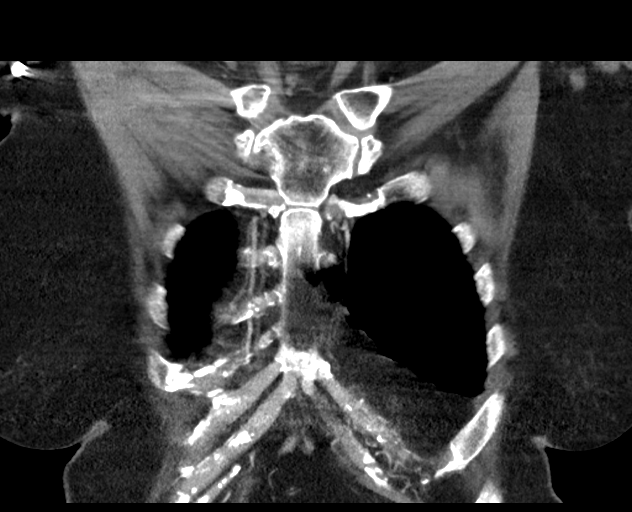
[im 45/90  soft-tissue]
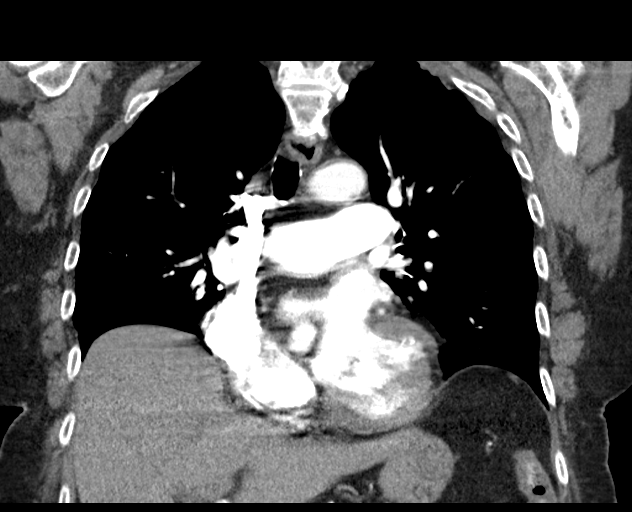
[im 67/90  soft-tissue]
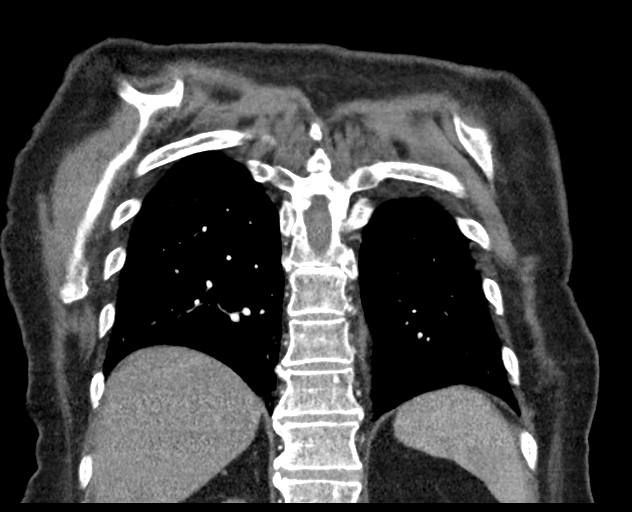

[18 of 46 positions shown; findings below may reference images not displayed]

FINDINGS: Cardiovascular: There is mild cardiomegaly. No pericardial effusion.
There is coronary vascular calcification. Mild atherosclerotic
calcification of the thoracic aorta. No aneurysmal dilatation.
Evaluation of the pulmonary arteries is limited due to respiratory
motion artifact. No pulmonary artery embolus identified. Faint
apparent non opacification of a subsegmental right upper lobe
pulmonary artery branch (1 [DATE]) likely artifactual.

Mediastinum/Nodes: No hilar or mediastinal adenopathy. The esophagus
is grossly unremarkable. No mediastinal fluid collection.

Lungs/Pleura: No focal consolidation, pleural effusion, or
pneumothorax. The central airways are patent.

Upper Abdomen: No acute abnormality. Colonic diverticulosis.

Musculoskeletal: Degenerative changes of the spine. Partially
visualized lower cervical ACDF. No acute osseous pathology.

Review of the MIP images confirms the above findings.
IMPRESSION: No acute intrathoracic pathology. No CT evidence of pulmonary
embolism.

## 2022-03-15 ENCOUNTER — Encounter: Payer: Self-pay | Admitting: Podiatry

## 2022-03-15 ENCOUNTER — Ambulatory Visit: Payer: Medicare PPO | Admitting: Podiatry

## 2022-03-15 DIAGNOSIS — M79676 Pain in unspecified toe(s): Secondary | ICD-10-CM | POA: Diagnosis not present

## 2022-03-15 DIAGNOSIS — B351 Tinea unguium: Secondary | ICD-10-CM | POA: Diagnosis not present

## 2022-03-15 NOTE — Progress Notes (Signed)
She presents today chief complaint of painful elongated toenails 1 through 5 bilaterally.  Objective: Vital signs are stable alert oriented x 3.  Pulses are palpable.  Toenails are long thick yellow dystrophic onychomycotic painful palpation as well as debridement.  Assessment: Pain limb secondary to onychomycosis.  Plan: Debridement of toenails 1 through 5 bilateral.

## 2022-03-17 ENCOUNTER — Ambulatory Visit: Payer: Medicare PPO | Admitting: Podiatry

## 2022-06-16 ENCOUNTER — Ambulatory Visit: Payer: Medicare PPO | Admitting: Podiatry

## 2022-06-16 ENCOUNTER — Encounter: Payer: Self-pay | Admitting: Podiatry

## 2022-06-16 DIAGNOSIS — M79676 Pain in unspecified toe(s): Secondary | ICD-10-CM | POA: Diagnosis not present

## 2022-06-16 DIAGNOSIS — B351 Tinea unguium: Secondary | ICD-10-CM

## 2022-06-16 NOTE — Progress Notes (Signed)
She presents today chief complaint of painful elongated toenails.  Objective: Pulses are palpable.  There is no erythema edema salines drainage or odor toenails are long thick yellow dystrophic onychomycotic painful palpation.  Assessment: Pain limb secondary to onychomycosis.  Plan: Debridement of toenails 1 through 5 bilateral covered service secondary to pain.

## 2022-09-15 ENCOUNTER — Encounter: Payer: Self-pay | Admitting: Podiatry

## 2022-09-15 ENCOUNTER — Ambulatory Visit: Payer: Medicare PPO | Admitting: Podiatry

## 2022-09-15 DIAGNOSIS — M79676 Pain in unspecified toe(s): Secondary | ICD-10-CM | POA: Diagnosis not present

## 2022-09-15 DIAGNOSIS — B351 Tinea unguium: Secondary | ICD-10-CM

## 2022-09-15 NOTE — Progress Notes (Signed)
Terri Bradley presents today for follow-up of her painful toenails.  Objective: Vital signs are stable alert oriented x 3.  Pulses are palpable.  Toenails are long thick yellow dystrophic clinically mycotic.  Assessment: Pain in limb secondary to onychomycosis.  Plan: Debridement of toenails 1 through 5 bilateral Carbosorb secondary to pain.

## 2022-12-22 ENCOUNTER — Ambulatory Visit: Payer: Medicare PPO | Admitting: Podiatry

## 2022-12-22 ENCOUNTER — Encounter: Payer: Self-pay | Admitting: Podiatry

## 2022-12-22 DIAGNOSIS — B351 Tinea unguium: Secondary | ICD-10-CM | POA: Diagnosis not present

## 2022-12-22 DIAGNOSIS — M79676 Pain in unspecified toe(s): Secondary | ICD-10-CM | POA: Diagnosis not present

## 2022-12-22 NOTE — Progress Notes (Signed)
She presents today chief complaint of painful elongated toenails.  Objective: Pulses are palpable.  Toenails are long thick yellow dystrophic clinically mycotic.  No open lesions or wounds are noted.  Assessment: Pain in limb secondary to onychomycosis.  Plan: Debridement of toenails 1 through 5 bilateral.  Follow-up with her in 3 months.

## 2023-03-13 ENCOUNTER — Other Ambulatory Visit: Payer: Self-pay | Admitting: Podiatry

## 2023-03-13 DIAGNOSIS — B351 Tinea unguium: Secondary | ICD-10-CM

## 2023-03-23 ENCOUNTER — Ambulatory Visit: Payer: Medicare PPO | Admitting: Podiatry

## 2023-04-11 ENCOUNTER — Encounter: Payer: Self-pay | Admitting: Podiatry

## 2023-04-11 ENCOUNTER — Ambulatory Visit: Payer: Medicare PPO | Admitting: Podiatry

## 2023-04-11 DIAGNOSIS — B351 Tinea unguium: Secondary | ICD-10-CM

## 2023-04-11 DIAGNOSIS — M79676 Pain in unspecified toe(s): Secondary | ICD-10-CM | POA: Diagnosis not present

## 2023-04-11 NOTE — Progress Notes (Signed)
 She presents today for chief complaint of painfully elongated toenails.  Objective: Pulses are palpable.  Toenails are long thick yellow dystrophic clinically mycotic third nail right foot has split and is difficulty eating.  Otherwise toenails are thick yellow dystrophic clinically mycotic painful palpation as well as debridement.  Assessment: Pain limb secondary to onychomycosis.  Plan: Debridement of toenails 1 through 5 bilateral.  Follow-up with her in 3 months

## 2023-07-18 ENCOUNTER — Encounter: Payer: Self-pay | Admitting: Podiatry

## 2023-07-18 ENCOUNTER — Ambulatory Visit: Admitting: Podiatry

## 2023-07-18 DIAGNOSIS — M79676 Pain in unspecified toe(s): Secondary | ICD-10-CM | POA: Diagnosis not present

## 2023-07-18 DIAGNOSIS — B351 Tinea unguium: Secondary | ICD-10-CM

## 2023-07-18 NOTE — Progress Notes (Signed)
 She presents today for chief complaint of painfully elongated toenails.  Objective: Pulses are palpable.  Toenails are long thick yellow dystrophic clinically mycotic third nail right foot has split and is difficulty eating.  Otherwise toenails are thick yellow dystrophic clinically mycotic painful palpation as well as debridement.  Assessment: Pain limb secondary to onychomycosis.  Plan: Debridement of toenails 1 through 5 bilateral.  Follow-up with her in 3 months

## 2023-08-17 ENCOUNTER — Other Ambulatory Visit: Payer: Self-pay

## 2023-08-17 ENCOUNTER — Observation Stay

## 2023-08-17 ENCOUNTER — Emergency Department

## 2023-08-17 ENCOUNTER — Observation Stay
Admission: EM | Admit: 2023-08-17 | Discharge: 2023-08-18 | Disposition: A | Discharge status: Home / Self Care | Attending: Hospitalist | Admitting: Hospitalist

## 2023-08-17 DIAGNOSIS — M4322 Fusion of spine, cervical region: Secondary | ICD-10-CM | POA: Diagnosis not present

## 2023-08-17 DIAGNOSIS — I2699 Other pulmonary embolism without acute cor pulmonale: Secondary | ICD-10-CM | POA: Diagnosis not present

## 2023-08-17 DIAGNOSIS — R600 Localized edema: Secondary | ICD-10-CM | POA: Diagnosis present

## 2023-08-17 DIAGNOSIS — I10 Essential (primary) hypertension: Secondary | ICD-10-CM | POA: Diagnosis not present

## 2023-08-17 DIAGNOSIS — Z981 Arthrodesis status: Secondary | ICD-10-CM | POA: Diagnosis not present

## 2023-08-17 DIAGNOSIS — Y92410 Unspecified street and highway as the place of occurrence of the external cause: Secondary | ICD-10-CM | POA: Diagnosis not present

## 2023-08-17 DIAGNOSIS — I82409 Acute embolism and thrombosis of unspecified deep veins of unspecified lower extremity: Secondary | ICD-10-CM | POA: Diagnosis present

## 2023-08-17 DIAGNOSIS — F32A Depression, unspecified: Secondary | ICD-10-CM | POA: Insufficient documentation

## 2023-08-17 DIAGNOSIS — R55 Syncope and collapse: Secondary | ICD-10-CM | POA: Diagnosis not present

## 2023-08-17 DIAGNOSIS — Z79899 Other long term (current) drug therapy: Secondary | ICD-10-CM | POA: Diagnosis not present

## 2023-08-17 DIAGNOSIS — I82463 Acute embolism and thrombosis of calf muscular vein, bilateral: Secondary | ICD-10-CM | POA: Diagnosis not present

## 2023-08-17 DIAGNOSIS — R0602 Shortness of breath: Secondary | ICD-10-CM | POA: Diagnosis present

## 2023-08-17 DIAGNOSIS — Z85038 Personal history of other malignant neoplasm of large intestine: Secondary | ICD-10-CM | POA: Insufficient documentation

## 2023-08-17 DIAGNOSIS — Z9049 Acquired absence of other specified parts of digestive tract: Secondary | ICD-10-CM | POA: Diagnosis not present

## 2023-08-17 DIAGNOSIS — Z041 Encounter for examination and observation following transport accident: Secondary | ICD-10-CM

## 2023-08-17 DIAGNOSIS — R42 Dizziness and giddiness: Secondary | ICD-10-CM | POA: Diagnosis present

## 2023-08-17 LAB — URINALYSIS, ROUTINE W REFLEX MICROSCOPIC
Bilirubin Urine: NEGATIVE
Glucose, UA: NEGATIVE mg/dL
Hgb urine dipstick: NEGATIVE
Ketones, ur: NEGATIVE mg/dL
Leukocytes,Ua: NEGATIVE
Nitrite: NEGATIVE
Protein, ur: NEGATIVE mg/dL
Specific Gravity, Urine: 1.008 (ref 1.005–1.030)
pH: 5 (ref 5.0–8.0)

## 2023-08-17 LAB — COMPREHENSIVE METABOLIC PANEL WITH GFR
ALT: 14 U/L (ref 0–44)
AST: 25 U/L (ref 15–41)
Albumin: 3.8 g/dL (ref 3.5–5.0)
Alkaline Phosphatase: 73 U/L (ref 38–126)
Anion gap: 14 (ref 5–15)
BUN: 26 mg/dL — ABNORMAL HIGH (ref 8–23)
CO2: 24 mmol/L (ref 22–32)
Calcium: 9.9 mg/dL (ref 8.9–10.3)
Chloride: 101 mmol/L (ref 98–111)
Creatinine, Ser: 1.16 mg/dL — ABNORMAL HIGH (ref 0.44–1.00)
GFR, Estimated: 44 mL/min — ABNORMAL LOW (ref >60)
Glucose, Bld: 186 mg/dL — ABNORMAL HIGH (ref 70–99)
Potassium: 3.1 mmol/L — ABNORMAL LOW (ref 3.5–5.1)
Sodium: 139 mmol/L (ref 135–145)
Total Bilirubin: 0.6 mg/dL (ref 0.0–1.2)
Total Protein: 7.3 g/dL (ref 6.5–8.1)

## 2023-08-17 LAB — CBC
HCT: 38.7 % (ref 36.0–46.0)
Hemoglobin: 12.6 g/dL (ref 12.0–15.0)
MCH: 30.1 pg (ref 26.0–34.0)
MCHC: 32.6 g/dL (ref 30.0–36.0)
MCV: 92.4 fL (ref 80.0–100.0)
Platelets: 338 K/uL (ref 150–400)
RBC: 4.19 MIL/uL (ref 3.87–5.11)
RDW: 13.8 % (ref 11.5–15.5)
WBC: 15.1 K/uL — ABNORMAL HIGH (ref 4.0–10.5)
nRBC: 0 % (ref 0.0–0.2)

## 2023-08-17 LAB — PROTIME-INR
INR: 0.9 (ref 0.8–1.2)
Prothrombin Time: 12.9 s (ref 11.4–15.2)

## 2023-08-17 LAB — TROPONIN I (HIGH SENSITIVITY)
Troponin I (High Sensitivity): 12 ng/L (ref <18)
Troponin I (High Sensitivity): 24 ng/L — ABNORMAL HIGH (ref <18)

## 2023-08-17 LAB — TSH: TSH: 1.329 u[IU]/mL (ref 0.350–4.500)

## 2023-08-17 LAB — APTT: aPTT: 25 s (ref 24–36)

## 2023-08-17 LAB — BRAIN NATRIURETIC PEPTIDE: B Natriuretic Peptide: 42.4 pg/mL (ref 0.0–100.0)

## 2023-08-17 MED ORDER — HEPARIN BOLUS VIA INFUSION
5000.0000 [IU] | Freq: Once | INTRAVENOUS | Status: AC
  Administered 2023-08-17: 5000 [IU] via INTRAVENOUS
  Filled 2023-08-17: qty 5000

## 2023-08-17 MED ORDER — ACETAMINOPHEN 325 MG PO TABS: 650.0000 mg | ORAL_TABLET | Freq: Four times a day (QID) | ORAL | Status: DC | PRN

## 2023-08-17 MED ORDER — HYDROCODONE-ACETAMINOPHEN 5-325 MG PO TABS: 1.0000 | ORAL_TABLET | ORAL | Status: DC | PRN

## 2023-08-17 MED ORDER — SERTRALINE HCL 50 MG PO TABS
25.0000 mg | ORAL_TABLET | Freq: Every day | ORAL | Status: DC
  Administered 2023-08-17 – 2023-08-18 (×2): 25 mg via ORAL
  Filled 2023-08-17 (×2): qty 1

## 2023-08-17 MED ORDER — HEPARIN (PORCINE) 25000 UT/250ML-% IV SOLN
1400.0000 [IU]/h | INTRAVENOUS | Status: DC
  Administered 2023-08-17: 1400 [IU]/h via INTRAVENOUS
  Filled 2023-08-17: qty 250

## 2023-08-17 MED ORDER — POTASSIUM CHLORIDE CRYS ER 20 MEQ PO TBCR
40.0000 meq | EXTENDED_RELEASE_TABLET | Freq: Once | ORAL | Status: AC
  Administered 2023-08-17: 40 meq via ORAL
  Filled 2023-08-17: qty 2

## 2023-08-17 MED ORDER — IOHEXOL 350 MG/ML SOLN
75.0000 mL | Freq: Once | INTRAVENOUS | Status: AC | PRN
Start: 2023-08-17 — End: 2023-08-17
  Administered 2023-08-17: 75 mL via INTRAVENOUS

## 2023-08-17 MED ORDER — ACETAMINOPHEN 325 MG RE SUPP: 650.0000 mg | Freq: Four times a day (QID) | RECTAL | Status: DC | PRN

## 2023-08-17 MED ORDER — ONDANSETRON HCL 4 MG/2ML IJ SOLN: 4.0000 mg | Freq: Four times a day (QID) | INTRAMUSCULAR | Status: DC | PRN

## 2023-08-17 MED ORDER — ONDANSETRON HCL 4 MG PO TABS
4.0000 mg | ORAL_TABLET | Freq: Four times a day (QID) | ORAL | Status: DC | PRN
Start: 2023-08-17 — End: 2023-08-18

## 2023-08-17 MED ORDER — SODIUM CHLORIDE 0.9% FLUSH
3.0000 mL | Freq: Two times a day (BID) | INTRAVENOUS | Status: DC
  Administered 2023-08-17: 3 mL via INTRAVENOUS
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
## 2023-08-17 NOTE — Assessment & Plan Note (Signed)
 S/p partial colectomy No acute issues suspected Patient has regular bowel movements.  Denies recent weight loss, black or bloody stool, abdominal pain
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
## 2023-08-17 NOTE — Assessment & Plan Note (Signed)
 Follow-up C-spine CT No complaints of neck pain

## 2023-08-17 NOTE — Assessment & Plan Note (Signed)
-   Continue home Zoloft

## 2023-08-17 NOTE — Assessment & Plan Note (Signed)
 Bilateral lower extremity DVT Suspect unprovoked as patient is active and independent in ADLs and IADLs,, though does  sit around the house a lot Venous Dopplers positive for DVT bilaterally Follow-up CTA chest--> left segmental PE without heart strain Continue heparin  infusion to transition to oral once stable In view of advanced age, can weigh pros and cons of further workup for etiology of unprovoked DVT --malignancy workup/hematology referral History of colon cancer but denies any GI issues, blood in stool, abdominal pain or distention, changes in bowel habits
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
## 2023-08-17 NOTE — Assessment & Plan Note (Signed)
 No apparent injury and patient voices no complaints Physical exam without evidence of trauma Head CT nonacute, CT C-spine pending

## 2023-08-17 NOTE — ED Notes (Signed)
 U/s at bedside

## 2023-08-17 NOTE — Assessment & Plan Note (Signed)
 Currently normotensive : Antihypertensive for tonight in view of syncopal episode

## 2023-08-17 NOTE — Assessment & Plan Note (Deleted)
 Acute PE Follow-up CTA chest--> left segmental PE without heart strain Continue heparin  infusion

## 2023-08-17 NOTE — Assessment & Plan Note (Signed)
 Suspect secondary to PE-pending CTA chest--> positive for PE Continuous cardiac monitoring, echo, carotid Doppler Neurologic checks with fall and aspiration precautions See management of VTE below
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
## 2023-08-18 ENCOUNTER — Telehealth (HOSPITAL_COMMUNITY): Payer: Self-pay | Admitting: Pharmacy Technician

## 2023-08-18 ENCOUNTER — Other Ambulatory Visit (HOSPITAL_COMMUNITY): Payer: Self-pay

## 2023-08-18 ENCOUNTER — Observation Stay
Admit: 2023-08-18 | Discharge: 2023-08-18 | Disposition: A | Discharge status: Home / Self Care | Attending: Internal Medicine

## 2023-08-18 ENCOUNTER — Observation Stay

## 2023-08-18 DIAGNOSIS — R55 Syncope and collapse: Principal | ICD-10-CM

## 2023-08-18 LAB — CBC
HCT: 32.7 % — ABNORMAL LOW (ref 36.0–46.0)
Hemoglobin: 10.7 g/dL — ABNORMAL LOW (ref 12.0–15.0)
MCH: 29.6 pg (ref 26.0–34.0)
MCHC: 32.7 g/dL (ref 30.0–36.0)
MCV: 90.3 fL (ref 80.0–100.0)
Platelets: 277 K/uL (ref 150–400)
RBC: 3.62 MIL/uL — ABNORMAL LOW (ref 3.87–5.11)
RDW: 13.7 % (ref 11.5–15.5)
WBC: 11.1 K/uL — ABNORMAL HIGH (ref 4.0–10.5)
nRBC: 0 % (ref 0.0–0.2)

## 2023-08-18 LAB — BASIC METABOLIC PANEL WITH GFR
Anion gap: 9 (ref 5–15)
BUN: 24 mg/dL — ABNORMAL HIGH (ref 8–23)
CO2: 28 mmol/L (ref 22–32)
Calcium: 9.1 mg/dL (ref 8.9–10.3)
Chloride: 102 mmol/L (ref 98–111)
Creatinine, Ser: 0.93 mg/dL (ref 0.44–1.00)
GFR, Estimated: 57 mL/min — ABNORMAL LOW (ref >60)
Glucose, Bld: 113 mg/dL — ABNORMAL HIGH (ref 70–99)
Potassium: 3.5 mmol/L (ref 3.5–5.1)
Sodium: 139 mmol/L (ref 135–145)

## 2023-08-18 LAB — HEPARIN LEVEL (UNFRACTIONATED): Heparin Unfractionated: >1.1 [IU]/mL — ABNORMAL HIGH (ref 0.30–0.70)

## 2023-08-18 LAB — ECHOCARDIOGRAM COMPLETE
AR max vel: 2.85 cm2
AV Area VTI: 3.14 cm2
AV Area mean vel: 3.06 cm2
AV Mean grad: 5 mmHg
AV Peak grad: 8.1 mmHg
Ao pk vel: 1.42 m/s
Area-P 1/2: 2.65 cm2
Height: 69 in
MV VTI: 2.63 cm2
S' Lateral: 2.5 cm
Weight: 2857.16 [oz_av]

## 2023-08-18 LAB — CBG MONITORING, ED: Glucose-Capillary: 110 mg/dL — ABNORMAL HIGH (ref 70–99)

## 2023-08-18 MED ORDER — APIXABAN 5 MG PO TABS: 5.0000 mg | ORAL_TABLET | Freq: Two times a day (BID) | ORAL | Status: DC

## 2023-08-18 MED ORDER — HEPARIN (PORCINE) 25000 UT/250ML-% IV SOLN
1100.0000 [IU]/h | INTRAVENOUS | Status: DC
  Administered 2023-08-18: 1100 [IU]/h via INTRAVENOUS
  Filled 2023-08-18: qty 250

## 2023-08-18 MED ORDER — LISINOPRIL 10 MG PO TABS
20.0000 mg | ORAL_TABLET | Freq: Every day | ORAL | Status: DC
  Administered 2023-08-18: 20 mg via ORAL
  Filled 2023-08-18: qty 2

## 2023-08-18 MED ORDER — APIXABAN 5 MG PO TABS: 5.0000 mg | ORAL_TABLET | Freq: Two times a day (BID) | ORAL | 2 refills | Status: AC

## 2023-08-18 MED ORDER — APIXABAN (ELIQUIS) VTE STARTER PACK (10MG AND 5MG): ORAL_TABLET | ORAL | 0 refills | Status: AC

## 2023-08-18 MED ORDER — APIXABAN 5 MG PO TABS
10.0000 mg | ORAL_TABLET | Freq: Two times a day (BID) | ORAL | Status: DC
  Administered 2023-08-18: 10 mg via ORAL
  Filled 2023-08-18: qty 2

## 2023-08-18 MED ORDER — LISINOPRIL-HYDROCHLOROTHIAZIDE 20-25 MG PO TABS: 1.0000 | ORAL_TABLET | Freq: Every day | ORAL | Status: DC

## 2023-08-18 MED ORDER — HYDROCHLOROTHIAZIDE 25 MG PO TABS
25.0000 mg | ORAL_TABLET | Freq: Every day | ORAL | Status: DC
  Administered 2023-08-18: 25 mg via ORAL
  Filled 2023-08-18: qty 1

## 2023-08-18 NOTE — Care Management Obs Status (Signed)
 MEDICARE OBSERVATION STATUS NOTIFICATION   Patient Details  Name: Terri Bradley MRN: [id number] Date of Birth: [birth year]   Medicare Observation Status Notification Given:  Chaney BRANDY CHRISTIANE LELON, CMA 08/18/2023, 11:16 AM

## 2023-08-18 NOTE — ED Notes (Signed)
 Per pharmacy heparin  drip off until 0800.

## 2023-08-18 NOTE — Telephone Encounter (Signed)
 Pharmacy Patient Advocate Encounter  Insurance verification completed.    The patient is insured through [place]. Patient has Medicare and is not eligible for a copay card, but may be able to apply for patient assistance or Medicare RX Payment Plan (Patient Must reach out to their plan, if eligible for payment plan), if available.    Ran test claim for Eliquis  Starter Pack and the current 30 day co-pay is $40.00.   This test claim was processed through [facility]- copay amounts may vary at other pharmacies due to pharmacy/plan contracts, or as the patient moves through the different stages of their insurance plan.

## 2023-08-18 NOTE — ED Notes (Signed)
 Pt up to toilet to void. Pt helped into a hospital gown. Pt back in bed with call light tied to railing and bed alarm on. No other needs at this time.
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
## 2023-08-18 NOTE — ED Notes (Signed)
 This tech assisted pt to the bathroom. Pt urinated in toilet. Pt was assisted back into bed. No other needs verbalized at this time.

## 2023-08-25 ENCOUNTER — Other Ambulatory Visit: Payer: Self-pay | Admitting: Internal Medicine

## 2023-08-25 DIAGNOSIS — I2602 Saddle embolus of pulmonary artery with acute cor pulmonale: Secondary | ICD-10-CM

## 2023-08-25 DIAGNOSIS — R1084 Generalized abdominal pain: Secondary | ICD-10-CM

## 2023-08-25 DIAGNOSIS — R634 Abnormal weight loss: Secondary | ICD-10-CM

## 2023-08-30 ENCOUNTER — Ambulatory Visit
Admission: RE | Admit: 2023-08-30 | Discharge: 2023-08-30 | Disposition: A | Discharge status: Home / Self Care | Source: Ambulatory Visit | Attending: Internal Medicine | Admitting: Internal Medicine

## 2023-08-30 DIAGNOSIS — R1084 Generalized abdominal pain: Secondary | ICD-10-CM | POA: Diagnosis present

## 2023-08-30 DIAGNOSIS — R634 Abnormal weight loss: Secondary | ICD-10-CM | POA: Insufficient documentation

## 2023-08-30 DIAGNOSIS — I2602 Saddle embolus of pulmonary artery with acute cor pulmonale: Secondary | ICD-10-CM | POA: Insufficient documentation

## 2023-08-30 MED ORDER — IOHEXOL 240 MG/ML SOLN
50.0000 mL | Freq: Once | INTRAMUSCULAR | Status: AC | PRN
  Administered 2023-08-30: 50 mL via ORAL

## 2023-08-30 MED ORDER — IOHEXOL 300 MG/ML  SOLN
85.0000 mL | Freq: Once | INTRAMUSCULAR | Status: AC | PRN
  Administered 2023-08-30: 85 mL via INTRAVENOUS

## 2023-10-19 ENCOUNTER — Ambulatory Visit: Admitting: Podiatry
